# Patient Record
Sex: Female | Born: 1960 | Race: White | Hispanic: No | Marital: Married | Smoking: Never smoker
Health system: Southern US, Community
[De-identification: ages and names within clinical notes are randomized; demographics above are authoritative.]

## PROBLEM LIST (undated history)

## (undated) DIAGNOSIS — IMO0002 Reserved for concepts with insufficient information to code with codable children: Secondary | ICD-10-CM

## (undated) DIAGNOSIS — Z9289 Personal history of other medical treatment: Secondary | ICD-10-CM

## (undated) DIAGNOSIS — R51 Headache: Secondary | ICD-10-CM

## (undated) DIAGNOSIS — Z124 Encounter for screening for malignant neoplasm of cervix: Secondary | ICD-10-CM

## (undated) DIAGNOSIS — R87619 Unspecified abnormal cytological findings in specimens from cervix uteri: Secondary | ICD-10-CM

## (undated) DIAGNOSIS — I4510 Unspecified right bundle-branch block: Secondary | ICD-10-CM

## (undated) DIAGNOSIS — R87629 Unspecified abnormal cytological findings in specimens from vagina: Secondary | ICD-10-CM

## (undated) DIAGNOSIS — R519 Headache, unspecified: Secondary | ICD-10-CM

## (undated) DIAGNOSIS — T7840XA Allergy, unspecified, initial encounter: Secondary | ICD-10-CM

## (undated) DIAGNOSIS — G8929 Other chronic pain: Secondary | ICD-10-CM

## (undated) HISTORY — PX: TONSILLECTOMY: SUR1361

## (undated) HISTORY — DX: Other chronic pain: G89.29

## (undated) HISTORY — DX: Encounter for screening for malignant neoplasm of cervix: Z12.4

## (undated) HISTORY — DX: Allergy, unspecified, initial encounter: T78.40XA

## (undated) HISTORY — DX: Personal history of other medical treatment: Z92.89

## (undated) HISTORY — PX: TUBAL LIGATION: SHX77

## (undated) HISTORY — DX: Headache: R51

## (undated) HISTORY — PX: ENDOMETRIAL ABLATION: SHX621

## (undated) HISTORY — DX: Headache, unspecified: R51.9

## (undated) HISTORY — DX: Unspecified abnormal cytological findings in specimens from vagina: R87.629

## (undated) HISTORY — DX: Unspecified abnormal cytological findings in specimens from cervix uteri: R87.619

## (undated) HISTORY — DX: Reserved for concepts with insufficient information to code with codable children: IMO0002

## (undated) HISTORY — PX: WISDOM TOOTH EXTRACTION: SHX21

---

## 1998-09-15 DIAGNOSIS — J020 Streptococcal pharyngitis: Secondary | ICD-10-CM | POA: Insufficient documentation

## 2001-09-15 DIAGNOSIS — J329 Chronic sinusitis, unspecified: Secondary | ICD-10-CM | POA: Insufficient documentation

## 2002-01-14 DIAGNOSIS — N390 Urinary tract infection, site not specified: Secondary | ICD-10-CM | POA: Insufficient documentation

## 2002-12-19 ENCOUNTER — Ambulatory Visit (HOSPITAL_COMMUNITY): Admission: RE | Admit: 2002-12-19 | Discharge: 2002-12-19 | Payer: Self-pay | Admitting: Obstetrics and Gynecology

## 2005-04-26 ENCOUNTER — Ambulatory Visit (HOSPITAL_COMMUNITY): Admission: RE | Admit: 2005-04-26 | Discharge: 2005-04-26 | Payer: Self-pay | Admitting: Obstetrics and Gynecology

## 2006-04-02 ENCOUNTER — Ambulatory Visit: Payer: Self-pay | Admitting: Family Medicine

## 2007-07-04 ENCOUNTER — Ambulatory Visit: Payer: Self-pay | Admitting: Family Medicine

## 2007-09-27 ENCOUNTER — Ambulatory Visit: Payer: Self-pay | Admitting: Family Medicine

## 2007-12-02 ENCOUNTER — Ambulatory Visit: Payer: Self-pay | Admitting: Family Medicine

## 2007-12-17 ENCOUNTER — Ambulatory Visit: Payer: Self-pay | Admitting: Family Medicine

## 2008-01-13 ENCOUNTER — Other Ambulatory Visit: Admission: RE | Admit: 2008-01-13 | Discharge: 2008-01-13 | Payer: Self-pay | Admitting: Obstetrics & Gynecology

## 2008-03-02 ENCOUNTER — Ambulatory Visit: Payer: Self-pay | Admitting: Family Medicine

## 2008-03-13 ENCOUNTER — Ambulatory Visit: Payer: Self-pay | Admitting: Family Medicine

## 2008-07-07 ENCOUNTER — Ambulatory Visit: Payer: Self-pay | Admitting: Family Medicine

## 2008-09-29 ENCOUNTER — Ambulatory Visit: Payer: Self-pay | Admitting: Family Medicine

## 2009-11-24 ENCOUNTER — Ambulatory Visit: Payer: Self-pay | Admitting: Family Medicine

## 2010-02-08 ENCOUNTER — Other Ambulatory Visit: Admission: RE | Admit: 2010-02-08 | Discharge: 2010-02-08 | Payer: Self-pay | Admitting: Obstetrics & Gynecology

## 2010-02-17 ENCOUNTER — Encounter: Admission: RE | Admit: 2010-02-17 | Discharge: 2010-02-17 | Payer: Self-pay | Admitting: Obstetrics & Gynecology

## 2010-06-13 ENCOUNTER — Ambulatory Visit: Payer: Self-pay | Admitting: Physician Assistant

## 2011-02-16 ENCOUNTER — Encounter: Payer: Self-pay | Admitting: Family Medicine

## 2011-02-16 DIAGNOSIS — J329 Chronic sinusitis, unspecified: Secondary | ICD-10-CM

## 2011-02-16 DIAGNOSIS — N390 Urinary tract infection, site not specified: Secondary | ICD-10-CM

## 2011-02-16 DIAGNOSIS — J020 Streptococcal pharyngitis: Secondary | ICD-10-CM
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
## 2011-03-03 NOTE — Op Note (Signed)
Rebecca [name], Rebecca [name] NO.:  [id number]   MEDICAL RECORD NO.:  [id number]                   PATIENT TYPE:  AMB   LOCATION:  DAY                                  FACILITY:  [facility]   PHYSICIAN:  Tilda Burrow, M.D.              DATE OF BIRTH:  June 08, 1961   DATE OF PROCEDURE:  12/19/2002  DATE OF DISCHARGE:                                 OPERATIVE REPORT   PREOPERATIVE DIAGNOSES:  Elective sterilization.   POSTOPERATIVE DIAGNOSES:  Elective sterilization.   PROCEDURE:  Laparoscopic tubal sterilization. Falope ring.   SURGEON:  Tilda Burrow, M.D.   ASSISTANT:  None.   ANESTHESIA:  General.   COMPLICATIONS:  None.   FINDINGS:  Normal tubes and ovaries, no evidence of endometriosis, excellent  pelvic support. Small uterus without abnormality.   DETAILS OF PROCEDURE:  The patient was taken to the operating room, prepped  and  draped for a combined abdominal and vaginal procedure, with Hulka  tenaculum attached to the cervix for uterine  manipulation. Bladder in-and-  out catheterization. An infraumbilical, 1 cm vertical incision, as well as a  transverse suprapubic 1 cm incision. Veress needle was used to introduce  pneumoperitoneum through the umbilical incision with the pneumoperitoneum  easily introduced under 10 mmHg of pressure. Introduction of the Veress  needle was done, carefully elevating the abdominal wall and orienting the  needle toward the pelvis.   The laparoscopic trocar was then carefully introduced into the abdomen using  a similar technique, and the laparoscope was used to visualize normal pelvic  anatomy with no evidence of bleeding or trauma. The suprapubic trocar was  introduced under direct visualization, and then attention was directed to  the left fallopian tube, which was identified up to its fimbriated end,  elevated and a mid-segment loop of the tube was drawn up into the Falope  ring applier, Marcaine 0.25%  applied to the surface of the tube and the  Falope ring applied, inspected, and found to be in satisfactory position.  The opposite tube was then treated in a similar fashion. The mesosalpinx  beneath the Falope ring on each side was then infiltrated with approximately  3 cc of Marcaine 0.25%, using a transabdominal approach with a 22-gauge  spinal needle. Then the laparoscopic equipment was removed after instilling  200 cc of saline into  the abdomen and deflating the abdomen. Subcuticular 4-0 Dexon was used to  close the skin and Steri-Strips were placed on the skin surface. Sponge and  needle counts were correct. The patient tolerated the procedure well, was  awakened, and went to the recovery room in good condition.  Tilda Burrow, M.D.    JVF/MEDQ  D:  12/19/2002  T:  12/19/2002  Job:  [id number]

## 2011-03-14 ENCOUNTER — Other Ambulatory Visit (HOSPITAL_COMMUNITY)
Admission: RE | Admit: 2011-03-14 | Discharge: 2011-03-14 | Disposition: A | Discharge status: Home / Self Care | Payer: BC Managed Care – PPO | Source: Ambulatory Visit | Attending: Obstetrics & Gynecology | Admitting: Obstetrics & Gynecology

## 2011-03-14 ENCOUNTER — Other Ambulatory Visit: Payer: Self-pay | Admitting: Obstetrics & Gynecology

## 2011-03-14 DIAGNOSIS — Z01419 Encounter for gynecological examination (general) (routine) without abnormal findings: Secondary | ICD-10-CM | POA: Insufficient documentation

## 2011-03-15 ENCOUNTER — Other Ambulatory Visit: Payer: Self-pay | Admitting: Obstetrics & Gynecology

## 2011-03-15 DIAGNOSIS — Z1231 Encounter for screening mammogram for malignant neoplasm of breast: Secondary | ICD-10-CM

## 2011-03-22 ENCOUNTER — Ambulatory Visit
Admission: RE | Admit: 2011-03-22 | Discharge: 2011-03-22 | Disposition: A | Discharge status: Home / Self Care | Payer: BC Managed Care – PPO | Source: Ambulatory Visit | Attending: Obstetrics & Gynecology | Admitting: Obstetrics & Gynecology

## 2011-03-22 DIAGNOSIS — Z1231 Encounter for screening mammogram for malignant neoplasm of breast: Secondary | ICD-10-CM

## 2011-08-24 ENCOUNTER — Encounter: Payer: Self-pay | Admitting: Medical

## 2011-08-24 ENCOUNTER — Ambulatory Visit (INDEPENDENT_AMBULATORY_CARE_PROVIDER_SITE_OTHER): Payer: BC Managed Care – PPO | Admitting: Medical

## 2011-08-24 VITALS — BP 110/78 | HR 72 | Temp 98.0°F | Resp 18 | Wt 150.0 lb

## 2011-08-24 DIAGNOSIS — R071 Chest pain on breathing: Secondary | ICD-10-CM

## 2011-08-24 DIAGNOSIS — R0789 Other chest pain: Secondary | ICD-10-CM
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
## 2011-08-24 NOTE — Patient Instructions (Signed)
Begin trial of Prilosec 30-45 minutes prior to breakfast.  Aleve OTC once daily.  If not improving in 1-2 weeks let me know.

## 2011-08-25 ENCOUNTER — Telehealth: Payer: Self-pay | Admitting: Family Medicine

## 2011-08-25 NOTE — Telephone Encounter (Signed)
Message copied by Janeice Robinson on Fri Aug 25, 2011  1:53 PM ------      Message from: Aleen Campi, DAVID S      Created: Thu Aug 24, 2011  9:01 PM       i forgot to have her do a urine sample Thursday.  If she is still having any urinary discomfort, frequent urination, blood in urine, burning, etc., she can come in for nurse visit to do clean catch urine and possibly culture.  I apologize for not remembering to do this before she left.              Also, if they are doing labs at her work tomorrow, she could ask them to check a dipstick urine and send me the result.

## 2011-09-13 ENCOUNTER — Encounter (HOSPITAL_COMMUNITY): Payer: Self-pay

## 2011-09-13 ENCOUNTER — Other Ambulatory Visit: Payer: Self-pay | Admitting: Obstetrics & Gynecology

## 2011-09-13 ENCOUNTER — Encounter (HOSPITAL_COMMUNITY)
Admission: RE | Admit: 2011-09-13 | Discharge: 2011-09-13 | Disposition: A | Discharge status: Home / Self Care | Payer: BC Managed Care – PPO | Source: Ambulatory Visit | Attending: Obstetrics & Gynecology | Admitting: Obstetrics & Gynecology

## 2011-09-13 ENCOUNTER — Encounter (HOSPITAL_COMMUNITY): Payer: Self-pay | Admitting: Pharmacy Technician

## 2011-09-13 LAB — SURGICAL PCR SCREEN: Staphylococcus aureus: NEGATIVE

## 2011-09-13 NOTE — Patient Instructions (Addendum)
20 Rebecca [name]  09/13/2011   Your procedure is scheduled on:  09/20/2011  Report to [facility] at  1020  AM.  Call this number if you have problems the morning of surgery: [phone]   Remember:   Do not eat food:After Midnight.  May have clear liquids:until Midnight .  Clear liquids include soda, tea, black coffee, apple or grape juice, broth.  Take these medicines the morning of surgery with A SIP OF WATER: none   Do not wear jewelry, make-up or nail polish.  Do not wear lotions, powders, or perfumes. You may wear deodorant.  Do not shave 48 hours prior to surgery.  Do not bring valuables to the hospital.  Contacts, dentures or bridgework may not be worn into surgery.  Leave suitcase in the car. After surgery it may be brought to your room.  For patients admitted to the hospital, checkout time is 11:00 AM the day of discharge.   Patients discharged the day of surgery will not be allowed to drive home.  Name and phone number of your driver: family  Special Instructions: CHG Shower Use Special Wash: 1/2 bottle night before surgery and 1/2 bottle morning of surgery.   Please read over the following fact sheets that you were given: Pain Booklet, MRSA Information, Surgical Site Infection Prevention, Anesthesia Post-op Instructions and Care and Recovery After Surgery Endometrial Ablation Endometrial ablation removes the lining of the uterus (endometrium). It is usually a same day, outpatient treatment. Ablation helps avoid major surgery (such as a hysterectomy). A hysterectomy is removal of the cervix and uterus. Endometrial ablation has less risk and complications, has a shorter recovery period and is less expensive. After endometrial ablation, most women will have little or no menstrual bleeding. You may not keep your fertility. Pregnancy is no longer likely after this procedure but if you are pre-menopausal, you still need to use a reliable method of birth control following the procedure  because pregnancy can occur. REASONS TO HAVE THE PROCEDURE MAY INCLUDE:  Heavy periods.   Bleeding that is causing anemia.   Anovulatory bleeding, very irregular, bleeding.   Bleeding submucous fibroids (on the lining inside the uterus) if they are smaller than 3 centimeters.  REASONS NOT TO HAVE THE PROCEDURE MAY INCLUDE:  You wish to have more children.   You have a pre-cancerous or cancerous problem. The cause of any abnormal bleeding must be diagnosed before having the procedure.   You have pain coming from the uterus.   You have a submucus fibroid larger than 3 centimeters.   You recently had a baby.   You recently had an infection in the uterus.   You have a severe retro-flexed, tipped uterus and cannot insert the instrument to do the ablation.   You had a Cesarean section or deep major surgery on the uterus.   The inner cavity of the uterus is too large for the endometrial ablation instrument.  RISKS AND COMPLICATIONS   Perforation of the uterus.   Bleeding.   Infection of the uterus, bladder or vagina.   Injury to surrounding organs.   Cutting the cervix.   An air bubble to the lung (air embolus).   Pregnancy following the procedure.   Failure of the procedure to help the problem requiring hysterectomy.   Decreased ability to diagnose cancer in the lining of the uterus.  BEFORE THE PROCEDURE  The lining of the uterus must be tested to make sure there is no pre-cancerous or cancer  cells present.   Medications may be given to make the lining of the uterus thinner.   Ultrasound may be used to evaluate the size and look for abnormalities of the uterus.   Future pregnancy is not desired.  PROCEDURE  There are different ways to destroy the lining of the uterus.   Resectoscope - radio frequency-alternating electric current is the most common one used.   Cryotherapy - freezing the lining of the uterus.   Heated Free Liquid - heated salt (saline)  solution inserted into the uterus.   Microwave - uses high energy microwaves in the uterus.   Thermal Balloon - a catheter with a balloon tip is inserted into the uterus and filled with heated fluid.  Your caregiver will talk with you about the method used in this clinic. They will also instruct you on the pros and cons of the procedure. Endometrial ablation is performed along with a procedure called operative hysteroscopy. A narrow viewing tube is inserted through the birth canal (vagina) and through the cervix into the uterus. A tiny camera attached to the viewing tube (hysteroscope) allows the uterine cavity to be shown on a TV monitor during surgery. Your uterus is filled with a harmless liquid to make the procedure easier. The lining of the uterus is then removed. The lining can also be removed with a resectoscope which allows your surgeon to cut away the lining of the uterus under direct vision. Usually, you will be able to go home within an hour after the procedure. HOME CARE INSTRUCTIONS   Do not drive for 24 hours.   No tampons, douching or intercourse for 2 weeks or until your caregiver approves.   Rest at home for 24 to 48 hours. You may then resume normal activities unless told differently by your caregiver.   Take your temperature two times a day for 4 days, and record it.   Take any medications your caregiver has ordered, as directed.   Use some form of contraception if you are pre-menopausal and do not want to get pregnant.  Bleeding after the procedure is normal. It varies from light spotting and mildly watery to bloody discharge for 4 to 6 weeks. You may also have mild cramping. Only take over-the-counter or prescription medicines for pain, discomfort, or fever as directed by your caregiver. Do not use aspirin, as this may aggravate bleeding. Frequent urination during the first 24 hours is normal. You will not know how effective your surgery is until at least 3 months after the  surgery. SEEK IMMEDIATE MEDICAL CARE IF:   Bleeding is heavier than a normal menstrual cycle.   An oral temperature above 102 F (38.9 C) develops.   You have increasing cramps or pains not relieved with medication or develop belly (abdominal) pain which does not seem to be related to the same area of earlier cramping and pain.   You are light headed, weak or have fainting episodes.   You develop pain in the shoulder strap areas.   You have chest or leg pain.   You have abnormal vaginal discharge.   You have painful urination.  Document Released: 08/11/2004 Document Revised: 06/14/2011 Document Reviewed: 11/09/2007 [facility] Patient Information 2012 [place], [place].PATIENT INSTRUCTIONS POST-ANESTHESIA  IMMEDIATELY FOLLOWING SURGERY:  Do not drive or operate machinery for the first twenty four hours after surgery.  Do not make any important decisions for twenty four hours after surgery or while taking narcotic pain medications or sedatives.  If you develop intractable nausea  and vomiting or a severe headache please notify your doctor immediately.  FOLLOW-UP:  Please make an appointment with your surgeon as instructed. You do not need to follow up with anesthesia unless specifically instructed to do so.  WOUND CARE INSTRUCTIONS (if applicable):  Keep a dry clean dressing on the anesthesia/puncture wound site if there is drainage.  Once the wound has quit draining you may leave it open to air.  Generally you should leave the bandage intact for twenty four hours unless there is drainage.  If the epidural site drains for more than 36-48 hours please call the anesthesia department.  QUESTIONS?:  Please feel free to call your physician or the hospital operator if you have any questions, and they will be happy to assist you.     [facility] Anesthesia Department [address] [place] [phone]

## 2011-09-15 ENCOUNTER — Other Ambulatory Visit (HOSPITAL_COMMUNITY): Payer: BC Managed Care – PPO

## 2011-09-20 ENCOUNTER — Encounter (HOSPITAL_COMMUNITY): Payer: Self-pay | Admitting: Anesthesiology

## 2011-09-20 ENCOUNTER — Encounter (HOSPITAL_COMMUNITY)
Admission: RE | Disposition: A | Discharge status: Home / Self Care | Payer: Self-pay | Source: Ambulatory Visit | Attending: Obstetrics & Gynecology

## 2011-09-20 ENCOUNTER — Ambulatory Visit (HOSPITAL_COMMUNITY): Payer: BC Managed Care – PPO | Admitting: Anesthesiology

## 2011-09-20 ENCOUNTER — Other Ambulatory Visit: Payer: Self-pay | Admitting: Obstetrics & Gynecology

## 2011-09-20 ENCOUNTER — Ambulatory Visit (HOSPITAL_COMMUNITY)
Admission: RE | Admit: 2011-09-20 | Discharge: 2011-09-20 | Disposition: A | Discharge status: Home / Self Care | Payer: BC Managed Care – PPO | Source: Ambulatory Visit | Attending: Obstetrics & Gynecology | Admitting: Obstetrics & Gynecology

## 2011-09-20 ENCOUNTER — Encounter (HOSPITAL_COMMUNITY): Payer: Self-pay | Admitting: *Deleted

## 2011-09-20 DIAGNOSIS — Z9889 Other specified postprocedural states: Secondary | ICD-10-CM

## 2011-09-20 DIAGNOSIS — N920 Excessive and frequent menstruation with regular cycle: Secondary | ICD-10-CM | POA: Insufficient documentation

## 2011-09-20 DIAGNOSIS — N946 Dysmenorrhea, unspecified: Secondary | ICD-10-CM | POA: Insufficient documentation

## 2011-09-20 SURGERY — DILATATION & CURETTAGE/HYSTEROSCOPY WITH THERMACHOICE ABLATION
Anesthesia: General | Site: Uterus | Wound class: Clean Contaminated

## 2011-09-20 MED ORDER — LACTATED RINGERS IV SOLN
INTRAVENOUS | Status: DC
  Administered 2011-09-20: 1000 mL via INTRAVENOUS

## 2011-09-20 MED ORDER — MIDAZOLAM HCL 2 MG/2ML IJ SOLN
1.0000 mg | INTRAMUSCULAR | Status: DC | PRN
  Administered 2011-09-20 (×2): 2 mg via INTRAVENOUS

## 2011-09-20 MED ORDER — SCOPOLAMINE 1 MG/3DAYS TD PT72
MEDICATED_PATCH | TRANSDERMAL | Status: AC
  Filled 2011-09-20: qty 1

## 2011-09-20 MED ORDER — LACTATED RINGERS IV SOLN: INTRAVENOUS | Status: DC

## 2011-09-20 MED ORDER — FENTANYL CITRATE 0.05 MG/ML IJ SOLN
INTRAMUSCULAR | Status: DC | PRN
  Administered 2011-09-20 (×2): 50 ug via INTRAVENOUS

## 2011-09-20 MED ORDER — SCOPOLAMINE 1 MG/3DAYS TD PT72
1.0000 | MEDICATED_PATCH | Freq: Once | TRANSDERMAL | Status: DC
  Administered 2011-09-20: 1.5 mg via TRANSDERMAL

## 2011-09-20 MED ORDER — ONDANSETRON HCL 4 MG/2ML IJ SOLN: 4.0000 mg | Freq: Once | INTRAMUSCULAR | Status: DC | PRN

## 2011-09-20 MED ORDER — MIDAZOLAM HCL 2 MG/2ML IJ SOLN: 1.0000 mg | INTRAMUSCULAR | Status: DC | PRN

## 2011-09-20 MED ORDER — DEXTROSE 5 % IV SOLN
INTRAVENOUS | Status: DC | PRN
  Administered 2011-09-20: 250 mL via INTRAVENOUS

## 2011-09-20 MED ORDER — FENTANYL CITRATE 0.05 MG/ML IJ SOLN
INTRAMUSCULAR | Status: AC
  Administered 2011-09-20: 50 ug via INTRAVENOUS
  Filled 2011-09-20: qty 2

## 2011-09-20 MED ORDER — FENTANYL CITRATE 0.05 MG/ML IJ SOLN
25.0000 ug | INTRAMUSCULAR | Status: DC | PRN
  Administered 2011-09-20: 50 ug via INTRAVENOUS

## 2011-09-20 MED ORDER — KETOROLAC TROMETHAMINE 30 MG/ML IJ SOLN
30.0000 mg | Freq: Once | INTRAMUSCULAR | Status: AC
  Administered 2011-09-20: 30 mg via INTRAVENOUS

## 2011-09-20 MED ORDER — DEXAMETHASONE SODIUM PHOSPHATE 4 MG/ML IJ SOLN: 4.0000 mg | Freq: Once | INTRAMUSCULAR | Status: DC

## 2011-09-20 MED ORDER — CEFAZOLIN SODIUM 1-5 GM-% IV SOLN
1.0000 g | INTRAVENOUS | Status: AC
  Administered 2011-09-20: 1 g via INTRAVENOUS

## 2011-09-20 MED ORDER — SODIUM CHLORIDE 0.9 % IR SOLN
Status: DC | PRN
  Administered 2011-09-20: 3000 mL

## 2011-09-20 MED ORDER — KETOROLAC TROMETHAMINE 30 MG/ML IJ SOLN
INTRAMUSCULAR | Status: AC
  Filled 2011-09-20: qty 1

## 2011-09-20 MED ORDER — DEXAMETHASONE SODIUM PHOSPHATE 4 MG/ML IJ SOLN
INTRAMUSCULAR | Status: AC
  Administered 2011-09-20: 4 mg via INTRAVENOUS
  Filled 2011-09-20: qty 1

## 2011-09-20 MED ORDER — KETOROLAC TROMETHAMINE 10 MG PO TABS: 10.0000 mg | ORAL_TABLET | Freq: Three times a day (TID) | ORAL | Status: AC | PRN

## 2011-09-20 MED ORDER — PROPOFOL 10 MG/ML IV BOLUS
INTRAVENOUS | Status: DC | PRN
  Administered 2011-09-20: 20 mg via INTRAVENOUS
  Administered 2011-09-20: 150 mg via INTRAVENOUS

## 2011-09-20 MED ORDER — CEFAZOLIN SODIUM 1-5 GM-% IV SOLN
INTRAVENOUS | Status: AC
  Filled 2011-09-20: qty 50

## 2011-09-20 MED ORDER — SODIUM CHLORIDE 0.9 % IR SOLN
Status: DC | PRN
  Administered 2011-09-20: 1000 mL

## 2011-09-20 MED ORDER — DEXAMETHASONE SODIUM PHOSPHATE 4 MG/ML IJ SOLN
4.0000 mg | Freq: Once | INTRAMUSCULAR | Status: AC
  Administered 2011-09-20: 4 mg via INTRAVENOUS

## 2011-09-20 MED ORDER — MIDAZOLAM HCL 2 MG/2ML IJ SOLN
INTRAMUSCULAR | Status: AC
  Administered 2011-09-20: 2 mg via INTRAVENOUS
  Filled 2011-09-20: qty 2

## 2011-09-20 MED ORDER — ONDANSETRON HCL 4 MG/2ML IJ SOLN
4.0000 mg | Freq: Once | INTRAMUSCULAR | Status: AC
  Administered 2011-09-20: 4 mg via INTRAVENOUS

## 2011-09-20 MED ORDER — SCOPOLAMINE 1 MG/3DAYS TD PT72: 1.0000 | MEDICATED_PATCH | Freq: Once | TRANSDERMAL | Status: DC

## 2011-09-20 MED ORDER — HYDROCODONE-ACETAMINOPHEN 5-500 MG PO TABS: 1.0000 | ORAL_TABLET | Freq: Four times a day (QID) | ORAL | Status: AC | PRN

## 2011-09-20 MED ORDER — ONDANSETRON HCL 4 MG/2ML IJ SOLN: 4.0000 mg | Freq: Once | INTRAMUSCULAR | Status: DC

## 2011-09-20 MED ORDER — LIDOCAINE HCL (PF) 1 % IJ SOLN
INTRAMUSCULAR | Status: AC
  Filled 2011-09-20: qty 5

## 2011-09-20 MED ORDER — ONDANSETRON HCL 4 MG/2ML IJ SOLN
INTRAMUSCULAR | Status: AC
  Administered 2011-09-20: 4 mg via INTRAVENOUS
  Filled 2011-09-20: qty 2

## 2011-09-20 MED ORDER — MIDAZOLAM HCL 2 MG/2ML IJ SOLN
INTRAMUSCULAR | Status: AC
  Filled 2011-09-20: qty 2

## 2011-09-20 MED ORDER — ONDANSETRON HCL 8 MG PO TABS: 8.0000 mg | ORAL_TABLET | Freq: Three times a day (TID) | ORAL | Status: AC | PRN

## 2011-09-20 MED ORDER — PROPOFOL 10 MG/ML IV EMUL
INTRAVENOUS | Status: AC
  Filled 2011-09-20: qty 20

## 2011-09-20 MED ORDER — LIDOCAINE HCL (CARDIAC) 10 MG/ML IV SOLN
INTRAVENOUS | Status: DC | PRN
  Administered 2011-09-20: 10 mg via INTRAVENOUS

## 2011-09-20 SURGICAL SUPPLY — 32 items
BAG DECANTER FOR FLEXI CONT (MISCELLANEOUS) ×2 IMPLANT
BAG HAMPER (MISCELLANEOUS) ×2 IMPLANT
CATH THERMACHOICE III (CATHETERS) ×2 IMPLANT
CLOTH BEACON ORANGE TIMEOUT ST (SAFETY) ×2 IMPLANT
COVER LIGHT HANDLE STERIS (MISCELLANEOUS) ×4 IMPLANT
FORMALIN 10 PREFIL 120ML (MISCELLANEOUS) ×2 IMPLANT
GAUZE SPONGE 4X4 16PLY XRAY LF (GAUZE/BANDAGES/DRESSINGS) ×2 IMPLANT
GLOVE BIOGEL PI IND STRL 6.5 (GLOVE) IMPLANT
GLOVE BIOGEL PI IND STRL 8 (GLOVE) ×1 IMPLANT
GLOVE BIOGEL PI INDICATOR 6.5 (GLOVE) ×1
GLOVE BIOGEL PI INDICATOR 8 (GLOVE) ×2
GLOVE ECLIPSE 6.5 STRL STRAW (GLOVE) ×1 IMPLANT
GLOVE ECLIPSE 7.0 STRL STRAW (GLOVE) ×1 IMPLANT
GLOVE ECLIPSE 8.0 STRL XLNG CF (GLOVE) ×2 IMPLANT
GOWN BRE IMP SLV AUR LG STRL (GOWN DISPOSABLE) ×1 IMPLANT
GOWN STRL REIN XL XLG (GOWN DISPOSABLE) ×2 IMPLANT
INST SET HYSTEROSCOPY (KITS) ×2 IMPLANT
IV D5W 500ML (IV SOLUTION) ×2 IMPLANT
IV NS IRRIG 3000ML ARTHROMATIC (IV SOLUTION) ×2 IMPLANT
KIT ROOM TURNOVER APOR (KITS) ×2 IMPLANT
MANIFOLD NEPTUNE II (INSTRUMENTS) ×2 IMPLANT
MARKER SKIN DUAL TIP RULER LAB (MISCELLANEOUS) ×2 IMPLANT
NS IRRIG 1000ML POUR BTL (IV SOLUTION) ×2 IMPLANT
PACK BASIC III (CUSTOM PROCEDURE TRAY) ×2
PACK SRG BSC III STRL LF ECLPS (CUSTOM PROCEDURE TRAY) ×1 IMPLANT
PAD ARMBOARD 7.5X6 YLW CONV (MISCELLANEOUS) ×2 IMPLANT
PAD TELFA 3X4 1S STER (GAUZE/BANDAGES/DRESSINGS) ×2 IMPLANT
PENCIL HANDSWITCHING (ELECTRODE) ×1 IMPLANT
SET BASIN LINEN APH (SET/KITS/TRAYS/PACK) ×2 IMPLANT
SET IRRIG Y TYPE TUR BLADDER L (SET/KITS/TRAYS/PACK) ×2 IMPLANT
SHEET LAVH (DRAPES) ×2 IMPLANT
YANKAUER SUCT BULB TIP 10FT TU (MISCELLANEOUS) ×2 IMPLANT

## 2011-09-20 NOTE — Anesthesia Procedure Notes (Addendum)
Procedure Name: LMA Insertion Date/Time: 09/20/2011 1:18 PM Performed by: Minerva Areola Pre-anesthesia Checklist: Patient identified, Patient being monitored, Emergency Drugs available, Timeout performed and Suction available Patient Re-evaluated:Patient Re-evaluated prior to inductionOxygen Delivery Method: Circle System Utilized Preoxygenation: Pre-oxygenation with 100% oxygen Intubation Type: IV induction Ventilation: Mask ventilation without difficulty LMA: LMA inserted LMA Size: 4.0 Number of attempts: 1 Placement Confirmation: positive ETCO2 and breath sounds checked- equal and bilateral

## 2011-09-20 NOTE — Op Note (Signed)
Preoperative diagnosis: Menometrorrhagia                                        Dysmenorrhea   Postoperative diagnoses: Same as above   Procedure: Hysteroscopy, uterine curettage, endometrial ablation  Surgeon: Despina Hidden MD  Anesthesia: Laryngeal mask airway  Findings: The endometrium was normal. There were no fibroid or other abnormalities.  Description of operation: The patient was taken to the operating room and placed in the supine position. She underwent general anesthesia using the laryngeal mask airway. She was placed in the dorsal lithotomy position and prepped and draped in the usual sterile fashion. A Graves speculum was placed and the anterior cervical lip was grasped with a single-tooth tenaculum. The cervix was dilated serially to allow passage of the hysteroscope. Diagnostic hysteroscopy was performed and was found to be normal. A vigorous uterine curettage was then performed and all tissue sent to pathology for evaluation. The ThermaChoice 3 endometrial ablation balloon was then used were 9 cc of D5W was required to maintain a pressure of 190-200 mm of mercury throughout the procedure.  Total therapy time was 8:42.    Temperature was 87 degrees Celsius.  All of the equipment worked well throughout the procedure. All of the fluid was returned at the end of the procedure. The patient was awakened from anesthesia and taken to the recovery room in good stable condition all counts were correct. She received 1 g of Ancef and 30 mg of Toradol preoperatively. She will be discharged from the recovery room and followed up in the office next week.  EURE,LUTHER H 2:00 PM 09/20/2011

## 2011-09-20 NOTE — Anesthesia Preprocedure Evaluation (Addendum)
Anesthesia Evaluation  Patient identified by MRN, date of birth, ID band Patient awake    Reviewed: Allergy & Precautions, H&P , NPO status , Patient's Chart, lab work & pertinent test results  History of Anesthesia Complications (+) PONV  Airway Mallampati: II      Dental  (+) Teeth Intact   Pulmonary neg pulmonary ROS,    Pulmonary exam normal       Cardiovascular neg cardio ROS Regular Normal    Neuro/Psych    GI/Hepatic   Endo/Other    Renal/GU      Musculoskeletal   Abdominal   Peds  Hematology   Anesthesia Other Findings   Reproductive/Obstetrics                          Anesthesia Physical Anesthesia Plan  ASA: I  Anesthesia Plan: General   Post-op Pain Management:    Induction: Intravenous  Airway Management Planned: LMA  Additional Equipment:   Intra-op Plan:   Post-operative Plan: Extubation in OR  Informed Consent: I have reviewed the patients History and Physical, chart, labs and discussed the procedure including the risks, benefits and alternatives for the proposed anesthesia with the patient or authorized representative who has indicated his/her understanding and acceptance.     Plan Discussed with:   Anesthesia Plan Comments:         Anesthesia Quick Evaluation

## 2011-09-20 NOTE — Anesthesia Postprocedure Evaluation (Signed)
Anesthesia Post Note  Patient: Rebecca [name]  Procedure(s) Performed:  DILATATION & CURETTAGE/HYSTEROSCOPY WITH THERMACHOICE ABLATION - Total therapy time: 8:42 ;  9  mls in,   9  mls out  Anesthesia type: General  Patient location: PACU  Post pain: Pain level controlled  Post assessment: Post-op Vital signs reviewed, Patient's Cardiovascular Status Stable, Respiratory Function Stable, Patent Airway, No signs of Nausea or vomiting and Pain level controlled  Last Vitals:  Filed Vitals:   09/20/11 1413  BP: 123/56  Pulse: 76  Temp:   Resp:     Post vital signs: Reviewed and stable R14 T36.9  Level of consciousness: awake and alert   Complications: No apparent anesthesia complications

## 2011-09-20 NOTE — Transfer of Care (Signed)
Immediate Anesthesia Transfer of Care Note  Patient: Rebecca [name]  Procedure(s) Performed:  DILATATION & CURETTAGE/HYSTEROSCOPY WITH THERMACHOICE ABLATION - Total therapy time: 8:42 ;  9  mls in,   9  mls out  Patient Location: PACU  Anesthesia Type: General  Level of Consciousness: awake  Airway & Oxygen Therapy: Patient Spontanous Breathing and non-rebreather face mask  Post-op Assessment: Report given to PACU RN, Post -op Vital signs reviewed and stable and Patient moving all extremities  Post vital signs: Reviewed and stable  Complications: No apparent anesthesia complications

## 2011-09-20 NOTE — H&P (Signed)
Rebecca [name] is an 50 y.o. female. Perimenopausal that despite all of our efforts cannot prevent her from bleeding continuously.  We have tried every conceivable progesterone regimen without success.  As a result we will proceed with ablation  Past Medical History  Diagnosis Date  . Herpes simplex   . PONV (postoperative nausea and vomiting)     Past Surgical History  Procedure Date  . Wisdom tooth extraction   . Tonsillectomy     age 76  . Tubal ligation 9 yrs ago    [name]    Family History  Problem Relation Age of Onset  . Arthritis Maternal Aunt   . Gallbladder disease Maternal Aunt   . Arthritis Paternal Aunt   . Arthritis Maternal Grandmother   . Arthritis Maternal Grandfather   . Cancer Maternal Grandfather     leukemia  . Diabetes Maternal Grandfather   . Arthritis Paternal Grandfather   . Asthma Paternal Grandfather   . COPD Paternal Grandfather   . Hyperlipidemia Mother   . Hypertension Mother   . Anesthesia problems Neg Hx   . Hypotension Neg Hx   . Malignant hyperthermia Neg Hx   . Pseudochol deficiency Neg Hx     Social History:  reports that she has never smoked. She does not have any smokeless tobacco history on file. She reports that she drinks about 4.2 ounces of alcohol per week. She reports that she does not use illicit drugs.  Allergies: No Known Allergies  Prescriptions prior to admission  Medication Sig Dispense Refill  . Estradiol (ELESTRIN) 0.52 MG/0.87 GM (0.06%) GEL Apply 1 application topically daily.       Marland Kitchen ibuprofen (ADVIL,MOTRIN) 200 MG tablet Take 400 mg by mouth every 6 (six) hours as needed. For pain         ROS  Review of Systems  Constitutional: Negative for fever, chills, weight loss, malaise/fatigue and diaphoresis.  HENT: Negative for hearing loss, ear pain, nosebleeds, congestion, sore throat, neck pain, tinnitus and ear discharge.   Eyes: Negative for blurred vision, double vision, photophobia, pain, discharge and  redness.  Respiratory: Negative for cough, hemoptysis, sputum production, shortness of breath, wheezing and stridor.   Cardiovascular: Negative for chest pain, palpitations, orthopnea, claudication, leg swelling and PND.  Gastrointestinal: Negative for abdominal pain. Negative for heartburn, nausea, vomiting, diarrhea, constipation, blood in stool and melena.  Genitourinary: Negative for dysuria, urgency, frequency, hematuria and flank pain.  Musculoskeletal: Negative for myalgias, back pain, joint pain and falls.  Skin: Negative for itching and rash.  Neurological: Negative for dizziness, tingling, tremors, sensory change, speech change, focal weakness, seizures, loss of consciousness, weakness and headaches.  Endo/Heme/Allergies: Negative for environmental allergies and polydipsia. Does not bruise/bleed easily.  Psychiatric/Behavioral: Negative for depression, suicidal ideas, hallucinations, memory loss and substance abuse. The patient is not nervous/anxious and does not have insomnia.      Blood pressure 126/84, pulse 74, temperature 97.6 F (36.4 C), temperature source Oral, resp. rate 19, last menstrual period 08/21/2011, SpO2 100.00%. Physical Exam Physical Exam  Vitals reviewed. Constitutional: She is oriented to person, place, and time. She appears well-developed and well-nourished.  HENT:  Head: Normocephalic and atraumatic.  Right Ear: External ear normal.  Left Ear: External ear normal.  Nose: Nose normal.  Mouth/Throat: Oropharynx is clear and moist.  Eyes: Conjunctivae and EOM are normal. Pupils are equal, round, and reactive to light. Right eye exhibits no discharge. Left eye exhibits no discharge. No scleral icterus.  Neck:  Normal range of motion. Neck supple. No tracheal deviation present. No thyromegaly present.  Cardiovascular: Normal rate, regular rhythm, normal heart sounds and intact distal pulses.  Exam reveals no gallop and no friction rub.   No murmur  heard. Respiratory: Effort normal and breath sounds normal. No respiratory distress. She has no wheezes. She has no rales. She exhibits no tenderness.  GI: Soft. Bowel sounds are normal. She exhibits no distension and no mass. There is no  tenderness. There is no rebound and no guarding.  Genitourinary:       Vulva is normal without lesions Vagina is pink moist without discharge Cervix normal in appearance and pap is normal Uterus is normal by sonogram Adnexa is negative with normal sized ovaries by sonogram  Musculoskeletal: Normal range of motion. She exhibits no edema and no tenderness.  Neurological: She is alert and oriented to person, place, and time. She has normal reflexes. She displays normal reflexes. No cranial nerve deficit. She exhibits normal muscle tone. Coordination normal.  Skin: Skin is warm and dry. No rash noted. No erythema. No pallor.  Psychiatric: She has a normal mood and affect. Her behavior is normal. Judgment and thought content normal.   Lab results from outside lab reviewed and normal        Assessment/Plan: Menometrorrhagia and dysmenorrhea, normal sonogram  Hysteroscopy D&C endometrial ablation.  Patient understands risks of failure    EURE,LUTHER H 09/20/2011, 12:26 PM

## 2012-02-14 DIAGNOSIS — Z124 Encounter for screening for malignant neoplasm of cervix: Secondary | ICD-10-CM

## 2012-02-14 HISTORY — DX: Encounter for screening for malignant neoplasm of cervix: Z12.4

## 2012-02-22 ENCOUNTER — Other Ambulatory Visit: Payer: Self-pay | Admitting: Obstetrics & Gynecology

## 2012-02-22 DIAGNOSIS — Z1231 Encounter for screening mammogram for malignant neoplasm of breast: Secondary | ICD-10-CM

## 2012-03-08 ENCOUNTER — Other Ambulatory Visit (HOSPITAL_COMMUNITY)
Admission: RE | Admit: 2012-03-08 | Discharge: 2012-03-08 | Disposition: A | Discharge status: Home / Self Care | Payer: BC Managed Care – PPO | Source: Ambulatory Visit | Attending: Obstetrics & Gynecology | Admitting: Obstetrics & Gynecology

## 2012-03-08 ENCOUNTER — Other Ambulatory Visit: Payer: Self-pay | Admitting: Obstetrics & Gynecology

## 2012-03-08 DIAGNOSIS — Z01419 Encounter for gynecological examination (general) (routine) without abnormal findings: Secondary | ICD-10-CM | POA: Insufficient documentation

## 2012-03-16 DIAGNOSIS — Z9289 Personal history of other medical treatment: Secondary | ICD-10-CM

## 2012-03-16 HISTORY — DX: Personal history of other medical treatment: Z92.89

## 2012-03-22 ENCOUNTER — Ambulatory Visit
Admission: RE | Admit: 2012-03-22 | Discharge: 2012-03-22 | Disposition: A | Discharge status: Home / Self Care | Payer: BC Managed Care – PPO | Source: Ambulatory Visit | Attending: Obstetrics & Gynecology | Admitting: Obstetrics & Gynecology

## 2012-03-22 DIAGNOSIS — Z1231 Encounter for screening mammogram for malignant neoplasm of breast: Secondary | ICD-10-CM

## 2012-05-14 ENCOUNTER — Ambulatory Visit (INDEPENDENT_AMBULATORY_CARE_PROVIDER_SITE_OTHER): Payer: BC Managed Care – PPO | Admitting: Medical

## 2012-05-14 ENCOUNTER — Encounter: Payer: Self-pay | Admitting: Medical

## 2012-05-14 VITALS — BP 128/80 | HR 79 | Temp 97.8°F | Resp 14 | Ht 66.0 in | Wt 146.0 lb

## 2012-05-14 DIAGNOSIS — Z0000 Encounter for general adult medical examination without abnormal findings: Secondary | ICD-10-CM

## 2012-05-14 DIAGNOSIS — R079 Chest pain, unspecified: Secondary | ICD-10-CM

## 2012-05-14 DIAGNOSIS — Z1211 Encounter for screening for malignant neoplasm of colon: Secondary | ICD-10-CM

## 2012-05-14 DIAGNOSIS — Z23 Encounter for immunization: Secondary | ICD-10-CM

## 2012-05-14 LAB — POCT URINALYSIS DIPSTICK
Blood, UA: NEGATIVE
Ketones, UA: NEGATIVE
Leukocytes, UA: NEGATIVE
Nitrite, UA: NEGATIVE
Protein, UA: NEGATIVE
pH, UA: 6

## 2012-05-14 LAB — COMPREHENSIVE METABOLIC PANEL
ALT: 9 U/L (ref 0–35)
BUN: 11 mg/dL (ref 6–23)
CO2: 26 mEq/L (ref 19–32)
Calcium: 10 mg/dL (ref 8.4–10.5)
Chloride: 105 mEq/L (ref 96–112)
Creat: 0.78 mg/dL (ref 0.50–1.10)
Total Bilirubin: 0.8 mg/dL (ref 0.3–1.2)

## 2012-05-14 LAB — CBC
HCT: 41.3 % (ref 36.0–46.0)
Hemoglobin: 14.1 g/dL (ref 12.0–15.0)
MCV: 88.1 fL (ref 78.0–100.0)
RDW: 13.7 % (ref 11.5–15.5)
WBC: 7 10*3/uL (ref 4.0–10.5)

## 2012-05-14 LAB — LIPID PANEL
HDL: 60 mg/dL (ref >39)
LDL Cholesterol: 122 mg/dL — ABNORMAL HIGH (ref 0–99)
Total CHOL/HDL Ratio: 3.3 Ratio
Triglycerides: 84 mg/dL (ref <150)
VLDL: 17 mg/dL (ref 0–40)

## 2012-05-14 LAB — TSH: TSH: 2.66 u[IU]/mL (ref 0.350–4.500)

## 2012-05-14 NOTE — Progress Notes (Signed)
Subjective:   HPI  Rebecca [name] is a 51 y.o. female who presents for a complete physical.  Preventative care: Last ophthalmology visit: 3 years ago Last dental visit: 48mo ago Last tetanus booster: unknown Flu vaccine: yearly at work  Concerns: She sees gynecology, just saw in May, updated pap, mammogram, normal.  Is age 28 now, and no prior colonoscopy.  She has mole on right upper leg she wants looked at.  Saw dermatology 2 years ago for same.  Its slow growing, but derm didn't feel it to be worrisome.  Still getting the same chest pain, chest wall pain, bruise like pain she saw me for in November.  Its intermittent, dull, but ongoing.  She went to [place] recently and felt some dizziness with walking.  No associated nausea, numbness, weakness, SOB, dyspnea. Not related to food or other activity.   Reviewed their medical, surgical, family, social, medication, and allergy history and updated chart as appropriate.  Past Medical History  Diagnosis Date  . Herpes simplex     oral  . Allergy   . Chronic headache     associated with tension or sinus problems  . H/O mammogram 03/2012    sees gynecology  . Pap smear for cervical cancer screening 02/2012    Dr. Cheron Every    Past Surgical History  Procedure Date  . Wisdom tooth extraction   . Tonsillectomy     age 29  . Tubal ligation 9 yrs ago    [name]  . Endometrial ablation     Family History  Problem Relation Age of Onset  . Arthritis Maternal Aunt   . Gallbladder disease Maternal Aunt   . Arthritis Paternal Aunt   . Arthritis Maternal Grandmother   . Arthritis Maternal Grandfather   . Cancer Maternal Grandfather     leukemia, colon  . Diabetes Maternal Grandfather   . Arthritis Paternal Grandfather   . Asthma Paternal Grandfather   . COPD Paternal Grandfather   . Hyperlipidemia Mother   . Hypertension Mother   . Anesthesia problems Neg Hx   . Hypotension Neg Hx   . Malignant hyperthermia Neg Hx     . Pseudochol deficiency Neg Hx   . Heart disease Neg Hx   . Stroke Neg Hx     History   Social History  . Marital Status: Married    Spouse Name: N/A    Number of Children: N/A  . Years of Education: N/A   Occupational History  . planning and Furniture conservator/restorer    Social History Main Topics  . Smoking status: Never Smoker   . Smokeless tobacco: Not on file  . Alcohol Use: 6.0 oz/week    10 Glasses of wine per week  . Drug Use: No  . Sexually Active: Yes    Birth Control/ Protection: Surgical   Other Topics Concern  . Not on file   Social History Narrative   Exercise with walking, hand weight training, separated, no children    Current Outpatient Prescriptions on File Prior to Visit  Medication Sig Dispense Refill  . Estradiol (ELESTRIN) 0.52 MG/0.87 GM (0.06%) GEL Apply 1 application topically daily.         No Known Allergies   Review of Systems Constitutional: -fever, -chills, -sweats, -unexpected weight change, -anorexia, -fatigue Allergy: -sneezing, -itching, -congestion Dermatology:+ changing moles, rash, lumps, new worrisome lesions ENT: -runny nose, -ear pain, -sore throat, -hoarseness, -sinus pain, -teeth pain, -tinnitus, -hearing loss, -epistaxis Cardiology:  +chest  pain, -palpitations, -edema, -orthopnea, -paroxysmal nocturnal dyspnea Respiratory: -cough, -shortness of breath, -dyspnea on exertion, -wheezing, -hemoptysis Gastroenterology: +abdominal pain, -nausea, -vomiting, -diarrhea, -constipation, -blood in stool, -changes in bowel movement, -dysphagia Hematology: -bleeding or bruising problems Musculoskeletal: -arthralgias, -myalgias, -joint swelling, -back pain, -neck pain, -cramping, -gait changes Ophthalmology: -vision changes, -eye redness, -itching, -discharge Urology: -dysuria, -difficulty urinating, -hematuria, -urinary frequency, -urgency, incontinence Neurology: -headache, -weakness, -tingling, -numbness, -speech abnormality, -memory loss,  -falls, -dizziness Psychology:  -depressed mood, -agitation, -sleep problems     Objective:   Physical Exam  Filed Vitals:   05/14/12 0813  BP: 128/80  Pulse: 79  Temp: 97.8 F (36.6 C)  Resp: 14    General appearance: alert, no distress, WD/WN, lean white female Skin: right anterior upper thigh distally with roundish 3mm pink lesion suggestive of actinic keratosis, several other benign scattered flat macules throughout, freckles throughout, tanned skin from once weekly tanning bed usage HEENT: normocephalic, conjunctiva/corneas normal, sclerae anicteric, PERRLA, EOMi, nares patent, no discharge or erythema, pharynx normal Oral cavity: MMM, tongue normal, teeth in good repair Neck: supple, no lymphadenopathy, no thyromegaly, no masses, normal ROM, no bruits Chest: mild tenderness of left chest wall inferiorly, left of sternum, on obvious abnormality, normal shape and expansion Heart: RRR, normal S1, S2, no murmurs Lungs: CTA bilaterally, no wheezes, rhonchi, or rales Abdomen: +bs, soft,mild generalized tenderness, non distended, no masses, no hepatomegaly, no splenomegaly, no bruits Back: non tender, normal ROM, no scoliosis Musculoskeletal: upper extremities non tender, no obvious deformity, normal ROM throughout, lower extremities non tender, no obvious deformity, normal ROM throughout Extremities: no edema, no cyanosis, no clubbing Pulses: 2+ symmetric, upper and lower extremities, normal cap refill Neurological: alert, oriented x 3, CN2-12 intact, strength normal upper extremities and lower extremities, sensation normal throughout, DTRs 2+ throughout, no cerebellar signs, gait normal Psychiatric: normal affect, behavior normal, pleasant  Breast/gyn/rectal - deferred to gynecology   Adult ECG Report  Indication: chest pain  Rate: 68bpm  Rhythm: normal sinus rhythm  QRS Axis: 71 degrees  PR Interval:  QRS Duration: 98ms  QTc:  Conduction Disturbances: RSR' in  V1, V2  Other Abnormalities: none  Patient's cardiac risk factors are: none.  EKG comparison: none  Narrative Interpretation: RSR in V1, V2, c/w right ventricular conduction delay, otherwise unremarkable EKG   CXR - no obvious mass, pneumonia or bony abnormality, no acute changes.     Assessment and Plan :    Encounter Diagnoses  Name Primary?  . Routine general medical examination at a health care facility Yes  . Chest pain   . Screen for colon cancer   . Need for Tdap vaccination    Physical exam - discussed healthy lifestyle, diet, exercise, preventative care, vaccinations, and addressed their concerns.  Handout given.  Chest pain - reviewed EKG and CXR, will send for labs  Screen for colon cancer - referral to Dr. Karilyn Cota for first screening colonoscopy  Advised tdap booster today but she declines.    Follow-up pending labs

## 2012-05-14 NOTE — Patient Instructions (Signed)

## 2012-05-20 ENCOUNTER — Telehealth: Payer: Self-pay | Admitting: Medical

## 2012-05-25 NOTE — Telephone Encounter (Signed)
error 

## 2012-06-06 ENCOUNTER — Encounter (INDEPENDENT_AMBULATORY_CARE_PROVIDER_SITE_OTHER): Payer: Self-pay | Admitting: *Deleted

## 2012-06-06 ENCOUNTER — Telehealth (INDEPENDENT_AMBULATORY_CARE_PROVIDER_SITE_OTHER): Payer: Self-pay | Admitting: *Deleted

## 2012-06-06 ENCOUNTER — Other Ambulatory Visit (INDEPENDENT_AMBULATORY_CARE_PROVIDER_SITE_OTHER): Payer: Self-pay | Admitting: *Deleted

## 2012-06-06 DIAGNOSIS — Z1211 Encounter for screening for malignant neoplasm of colon: Secondary | ICD-10-CM

## 2012-06-06 NOTE — Telephone Encounter (Signed)
Patient needs movi prep 

## 2012-06-07 MED ORDER — PEG-KCL-NACL-NASULF-NA ASC-C 100 G PO SOLR: 1.0000 | Freq: Once | ORAL | Status: DC

## 2012-07-24 ENCOUNTER — Encounter (HOSPITAL_COMMUNITY): Payer: Self-pay | Admitting: Pharmacy Technician

## 2012-07-24 ENCOUNTER — Telehealth (INDEPENDENT_AMBULATORY_CARE_PROVIDER_SITE_OTHER): Payer: Self-pay | Admitting: *Deleted

## 2012-07-24 NOTE — Telephone Encounter (Signed)
PCP/Requesting MD: Sharlot Gowda  Name & DOB: Marcial Pacas Aug 02, 1961   Procedure: tcs  Reason/Indication:  screening  Has patient had this procedure before?  no  If so, when, by whom and where?    Is there a family history of colon cancer?  no  Who?  What age when diagnosed?    Is patient diabetic?   no      Does patient have prosthetic heart valve?  no  Do you have a pacemaker?  no  Has patient had joint replacement within last 12 months?  no  Is patient on Coumadin, Plavix and/or Aspirin? no  Medications: progesterone 200 mg daily, elestrin gel daily  Allergies: nkda  Medication Adjustment:   Procedure date & time: 08/07/12 at 930

## 2012-07-25 NOTE — Telephone Encounter (Signed)
agree

## 2012-08-07 ENCOUNTER — Encounter (HOSPITAL_COMMUNITY): Payer: Self-pay | Admitting: *Deleted

## 2012-08-07 ENCOUNTER — Ambulatory Visit (HOSPITAL_COMMUNITY)
Admission: RE | Admit: 2012-08-07 | Discharge: 2012-08-07 | Disposition: A | Discharge status: Home Health | Payer: BC Managed Care – PPO | Source: Ambulatory Visit | Attending: Internal Medicine | Admitting: Internal Medicine

## 2012-08-07 ENCOUNTER — Encounter (HOSPITAL_COMMUNITY)
Admission: RE | Disposition: A | Discharge status: Home Health | Payer: Self-pay | Source: Ambulatory Visit | Attending: Internal Medicine

## 2012-08-07 DIAGNOSIS — Z1211 Encounter for screening for malignant neoplasm of colon: Secondary | ICD-10-CM | POA: Insufficient documentation

## 2012-08-07 DIAGNOSIS — K6389 Other specified diseases of intestine: Secondary | ICD-10-CM

## 2012-08-07 DIAGNOSIS — Z8371 Family history of colonic polyps: Secondary | ICD-10-CM | POA: Insufficient documentation

## 2012-08-07 DIAGNOSIS — K644 Residual hemorrhoidal skin tags: Secondary | ICD-10-CM | POA: Insufficient documentation

## 2012-08-07 DIAGNOSIS — Z83719 Family history of colon polyps, unspecified: Secondary | ICD-10-CM | POA: Insufficient documentation

## 2012-08-07 HISTORY — DX: Unspecified right bundle-branch block: I45.10

## 2012-08-07 HISTORY — PX: COLONOSCOPY: SHX5424

## 2012-08-07 SURGERY — COLONOSCOPY
Anesthesia: Moderate Sedation

## 2012-08-07 MED ORDER — MIDAZOLAM HCL 5 MG/5ML IJ SOLN
INTRAMUSCULAR | Status: AC
  Filled 2012-08-07: qty 10

## 2012-08-07 MED ORDER — MEPERIDINE HCL 50 MG/ML IJ SOLN
INTRAMUSCULAR | Status: DC | PRN
  Administered 2012-08-07 (×2): 25 mg via INTRAVENOUS

## 2012-08-07 MED ORDER — SODIUM CHLORIDE 0.45 % IV SOLN
INTRAVENOUS | Status: DC
  Administered 2012-08-07: 09:00:00 via INTRAVENOUS

## 2012-08-07 MED ORDER — MEPERIDINE HCL 50 MG/ML IJ SOLN
INTRAMUSCULAR | Status: AC
  Filled 2012-08-07: qty 1

## 2012-08-07 MED ORDER — STERILE WATER FOR IRRIGATION IR SOLN
Status: DC | PRN
  Administered 2012-08-07: 09:00:00

## 2012-08-07 MED ORDER — MIDAZOLAM HCL 5 MG/5ML IJ SOLN
INTRAMUSCULAR | Status: DC | PRN
  Administered 2012-08-07 (×3): 2 mg via INTRAVENOUS
  Administered 2012-08-07 (×2): 1 mg via INTRAVENOUS
  Administered 2012-08-07: 2 mg via INTRAVENOUS

## 2012-08-07 NOTE — Op Note (Signed)
COLONOSCOPY PROCEDURE REPORT  PATIENT:  Rebecca [name]  MR#:  [id number] Birthdate:  07-Mar-1961, 52 y.o., female Endoscopist:  Dr. Malissa Hippo, MD Referred By:  Dr. Carollee Herter, MD Procedure Date: 08/07/2012  Procedure:   Colonoscopy  Indications:  Patient is 51 year old Caucasian female was undergoing screening colonoscopy. Family history is negative for colorectal carcinoma but her mother had adenomas at age 23.  Informed Consent:  The procedure and risks were reviewed with the patient and informed consent was obtained.  Medications:  Demerol 50 mg IV Versed 10 mg IV  Description of procedure:  After a digital rectal exam was performed, that colonoscope was advanced from the anus through the rectum and colon to the area of the cecum, ileocecal valve and appendiceal orifice. The cecum was deeply intubated. These structures were well-seen and photographed for the record. From the level of the cecum and ileocecal valve, the scope was slowly and cautiously withdrawn. The mucosal surfaces were carefully surveyed utilizing scope tip to flexion to facilitate fold flattening as needed. The scope was pulled down into the rectum where a thorough exam including retroflexion was performed. Terminal ileum was also examined.   Findings:   Prep excellent. Normal terminal ileum. Normal mucosa of the colon throughout. Normal rectal mucosa. Small hemorrhoids below the dentate line and single anal papilla.  Therapeutic/Diagnostic Maneuvers Performed:  None  Complications:  None  Cecal Withdrawal Time:  10  minutes  Impression:  Normal terminal ileum. Normal colonoscopy except small external hemorrhoids and single anal papilla.  Recommendations:  Standard instructions given. Next screening exam in 10 years.  Jennalynn Rivard U  08/07/2012 10:02 AM  CC: Dr. Carollee Herter, MD & Dr. Bonnetta Barry ref. provider found

## 2012-08-07 NOTE — H&P (Signed)
Rebecca [name] is an 51 y.o. female.   Chief Complaint: Patient is here for colonoscopy. HPI: Patient is 51 year old Caucasian female who is in for screening colonoscopy. She denies change in bowel habits rectal bleeding or abdominal pain. Family history is significant for colonic adenomas and her mother just had her first colonoscopy at age 60. Family history is negative for CRC.  Past Medical History  Diagnosis Date  . Herpes simplex     oral  . Allergy   . Chronic headache     associated with tension or sinus problems  . H/O mammogram 03/2012    sees gynecology  . Pap smear for cervical cancer screening 02/2012    Dr. Despina [name], [facility]  . Right bundle branch block     Past Surgical History  Procedure Date  . Wisdom tooth extraction   . Tonsillectomy     age 54  . Tubal ligation 9 yrs ago    Rebecca [name]  . Endometrial ablation     Family History  Problem Relation Age of Onset  . Arthritis Maternal Aunt   . Gallbladder disease Maternal Aunt   . Arthritis Paternal Aunt   . Arthritis Maternal Grandmother   . Arthritis Maternal Grandfather   . Cancer Maternal Grandfather     leukemia, colon  . Diabetes Maternal Grandfather   . Colon cancer Maternal Grandfather   . Arthritis Paternal Grandfather   . Asthma Paternal Grandfather   . COPD Paternal Grandfather   . Hyperlipidemia Mother   . Hypertension Mother   . Anesthesia problems Neg Hx   . Hypotension Neg Hx   . Malignant hyperthermia Neg Hx   . Pseudochol deficiency Neg Hx   . Heart disease Neg Hx   . Stroke Neg Hx    Social History:  reports that she has never smoked. She does not have any smokeless tobacco history on file. She reports that she drinks about 4.2 ounces of alcohol per week. She reports that she does not use illicit drugs.  Allergies: No Known Allergies  Medications Prior to Admission  Medication Sig Dispense Refill  . Estradiol (ELESTRIN) 0.52 MG/0.87 GM (0.06%) GEL Apply 1 application topically  daily.       . peg 3350 powder (MOVIPREP) 100 G SOLR Take 1 kit (100 g total) by mouth once.  1 kit  0  . progesterone (PROMETRIUM) 100 MG capsule Take 100 mg by mouth daily.        No results found for this or any previous visit (from the past 48 hour(s)). No results found.  ROS  Blood pressure 130/84, pulse 77, temperature 97.5 F (36.4 C), temperature source Oral, resp. rate 16, height 5\' 6"  (1.676 m), weight 146 lb (66.225 kg), SpO2 100.00%. Physical Exam  Constitutional: She appears well-developed and well-nourished.  HENT:  Mouth/Throat: Oropharynx is clear and moist.  Eyes: Conjunctivae normal are normal. No scleral icterus.  Neck: No thyromegaly present.  Cardiovascular: Normal rate, regular rhythm and normal heart sounds.   No murmur heard. Respiratory: Effort normal and breath sounds normal.  GI: Soft. She exhibits no distension and no mass. There is no tenderness.  Musculoskeletal: She exhibits no edema.  Lymphadenopathy:    She has no cervical adenopathy.  Neurological: She is alert.  Skin: Skin is warm and dry.     Assessment/Plan Average risk screening colonoscopy.  Rebecca [name] 08/07/2012, 9:23 AM

## 2012-08-14 ENCOUNTER — Encounter (HOSPITAL_COMMUNITY): Payer: Self-pay | Admitting: Internal Medicine

## 2013-02-12 ENCOUNTER — Other Ambulatory Visit: Payer: Self-pay

## 2013-02-12 DIAGNOSIS — Z1231 Encounter for screening mammogram for malignant neoplasm of breast: Secondary | ICD-10-CM

## 2013-03-17 ENCOUNTER — Encounter: Payer: Self-pay | Admitting: *Deleted

## 2013-03-18 ENCOUNTER — Encounter: Payer: Self-pay | Admitting: Obstetrics & Gynecology

## 2013-03-18 ENCOUNTER — Ambulatory Visit (INDEPENDENT_AMBULATORY_CARE_PROVIDER_SITE_OTHER): Payer: BC Managed Care – PPO | Admitting: Obstetrics & Gynecology

## 2013-03-18 VITALS — BP 120/80 | Ht 66.0 in | Wt 151.0 lb

## 2013-03-18 DIAGNOSIS — Z01419 Encounter for gynecological examination (general) (routine) without abnormal findings: Secondary | ICD-10-CM

## 2013-03-18 DIAGNOSIS — Z1212 Encounter for screening for malignant neoplasm of rectum: Secondary | ICD-10-CM

## 2013-03-18 MED ORDER — PROGESTERONE MICRONIZED 100 MG PO CAPS: 100.0000 mg | ORAL_CAPSULE | Freq: Every day | ORAL | Status: DC

## 2013-03-18 MED ORDER — ESTRADIOL 0.52 MG/0.87 GM (0.06%) TD GEL: 1.0000 "application " | Freq: Every day | TRANSDERMAL | Status: DC

## 2013-03-18 NOTE — Progress Notes (Signed)
Patient ID: Rebecca [name], female   DOB: 17-Jun-1961, 52 y.o.   MRN: [id number] Subjective:     Rebecca [name] is a 52 y.o. female here for a routine exam.  No LMP recorded. Patient has had an ablation. No obstetric history on file. Current complaints: none.  Personal health questionnaire reviewed: yes.   Gynecologic History No LMP recorded. Patient has had an ablation. Contraception: post menopausal status Last Pap: 2013. Results were: normal Last mammogram: 2013. Results were: normal  Obstetric History OB History   Grav Para Term Preterm Abortions TAB SAB Ect Mult Living                   The following portions of the patient's history were reviewed and updated as appropriate: allergies, current medications, past family history, past social history, past surgical history and problem list.  Review of Systems  Review of Systems  Constitutional: Negative for fever, chills, weight loss, malaise/fatigue and diaphoresis.  HENT: Negative for hearing loss, ear pain, nosebleeds, congestion, sore throat, neck pain, tinnitus and ear discharge.   Eyes: Negative for blurred vision, double vision, photophobia, pain, discharge and redness.  Respiratory: Negative for cough, hemoptysis, sputum production, shortness of breath, wheezing and stridor.   Cardiovascular: Negative for chest pain, palpitations, orthopnea, claudication, leg swelling and PND.  Gastrointestinal: negative for abdominal pain. Negative for heartburn, nausea, vomiting, diarrhea, constipation, blood in stool and melena.  Genitourinary: Negative for dysuria, urgency, frequency, hematuria and flank pain.  Musculoskeletal: Negative for myalgias, back pain, joint pain and falls.  Skin: Negative for itching and rash.  Neurological: Negative for dizziness, tingling, tremors, sensory change, speech change, focal weakness, seizures, loss of consciousness, weakness and headaches.  Endo/Heme/Allergies: Negative for environmental  allergies and polydipsia. Does not bruise/bleed easily.  Psychiatric/Behavioral: Negative for depression, suicidal ideas, hallucinations, memory loss and substance abuse. The patient is not nervous/anxious and does not have insomnia.        Objective:    Physical Exam  Vitals reviewed. Constitutional: She is oriented to person, place, and time. She appears well-developed and well-nourished.  HENT:  Head: Normocephalic and atraumatic.        Right Ear: External ear normal.  Left Ear: External ear normal.  Nose: Nose normal.  Mouth/Throat: Oropharynx is clear and moist.  Eyes: Conjunctivae and EOM are normal. Pupils are equal, round, and reactive to light. Right eye exhibits no discharge. Left eye exhibits no discharge. No scleral icterus.  Neck: Normal range of motion. Neck supple. No tracheal deviation present. No thyromegaly present.  Cardiovascular: Normal rate, regular rhythm, normal heart sounds and intact distal pulses.  Exam reveals no gallop and no friction rub.   No murmur heard. Respiratory: Effort normal and breath sounds normal. No respiratory distress. She has no wheezes. She has no rales. She exhibits no tenderness.  GI: Soft. Bowel sounds are normal. She exhibits no distension and no mass. There is no tenderness. There is no rebound and no guarding.  Genitourinary:       Vulva is normal without lesions Vagina is pink moist without discharge Cervix normal in appearance and pap is done Uterus is normal size shape and contour Adnexa is negative with normal sized ovaries   Musculoskeletal: Normal range of motion. She exhibits no edema and no tenderness.  Neurological: She is alert and oriented to person, place, and time. She has normal reflexes. She displays normal reflexes. No cranial nerve deficit. She exhibits normal muscle tone. Coordination normal.  Skin: Skin is warm and dry. No rash noted. No erythema. No pallor.  Psychiatric: She has a normal mood and affect. Her  behavior is normal. Judgment and thought content normal.       Assessment:    Healthy female exam.    Plan:    Hormone replacement therapy: hormone replacement therapy: Elestrin. Mammogram ordered. Follow up in: 1 year.

## 2013-03-26 ENCOUNTER — Ambulatory Visit
Admission: RE | Admit: 2013-03-26 | Discharge: 2013-03-26 | Disposition: A | Discharge status: Home / Self Care | Payer: BC Managed Care – PPO | Source: Ambulatory Visit

## 2013-03-26 DIAGNOSIS — Z1231 Encounter for screening mammogram for malignant neoplasm of breast: Secondary | ICD-10-CM

## 2013-12-29 ENCOUNTER — Other Ambulatory Visit: Payer: Self-pay | Admitting: Obstetrics & Gynecology

## 2014-03-23 ENCOUNTER — Ambulatory Visit (INDEPENDENT_AMBULATORY_CARE_PROVIDER_SITE_OTHER): Payer: BC Managed Care – PPO | Admitting: Obstetrics & Gynecology

## 2014-03-23 ENCOUNTER — Other Ambulatory Visit (HOSPITAL_COMMUNITY)
Admission: RE | Admit: 2014-03-23 | Discharge: 2014-03-23 | Disposition: A | Discharge status: Home / Self Care | Payer: BC Managed Care – PPO | Source: Ambulatory Visit | Attending: Obstetrics & Gynecology | Admitting: Obstetrics & Gynecology

## 2014-03-23 ENCOUNTER — Encounter: Payer: Self-pay | Admitting: Obstetrics & Gynecology

## 2014-03-23 VITALS — BP 120/80 | Ht 66.0 in | Wt 151.4 lb

## 2014-03-23 DIAGNOSIS — Z01419 Encounter for gynecological examination (general) (routine) without abnormal findings: Secondary | ICD-10-CM | POA: Insufficient documentation

## 2014-03-23 DIAGNOSIS — Z1151 Encounter for screening for human papillomavirus (HPV): Secondary | ICD-10-CM | POA: Insufficient documentation

## 2014-03-23 MED ORDER — ESTRADIOL 0.52 MG/0.87 GM (0.06%) TD GEL: 2.0000 "application " | Freq: Every day | TRANSDERMAL | Status: DC

## 2014-03-23 MED ORDER — PROGESTERONE MICRONIZED 200 MG PO CAPS: ORAL_CAPSULE | ORAL | Status: DC

## 2014-03-23 NOTE — Addendum Note (Signed)
Addended by: Richardson Chiquito on: 03/23/2014 04:16 PM   Modules accepted: Orders

## 2014-03-23 NOTE — Progress Notes (Signed)
Patient ID: Rebecca [name], female   DOB: 12-21-1960, 53 y.o.   MRN: [id number] Subjective:     Rebecca [name] is a 53 y.o. female here for a routine exam.  No LMP recorded. Patient has had an ablation. No obstetric history on file. Birth Control Method:  na Menstrual Calendar(currently): amenorrheic  Current complaints: none.   Current acute medical issues:  none   Recent Gynecologic History No LMP recorded. Patient has had an ablation. Last Pap: 2014,  normal Last mammogram: 2014,  normal  Past Medical History  Diagnosis Date  . Herpes simplex     oral  . Allergy   . Chronic headache     associated with tension or sinus problems  . H/O mammogram 03/2012    sees gynecology  . Pap smear for cervical cancer screening 02/2012    [place]. Despina [place]ure, [place]  . Right bundle branch block   . Abnormal Pap smear     Past Surgical History  Procedure Laterality Date  . Wisdom tooth extraction    . Tonsillectomy      age 89  . Tubal ligation  9 yrs ago    [facility]  . Endometrial ablation    . Colonoscopy  08/07/2012    Procedure: COLONOSCOPY;  Surgeon: Malissa HippoNajeeb U Rehman, MD;  Location: [facility];  Service: Endoscopy;  Laterality: N/A;  930    OB History   Grav Para Term Preterm Abortions TAB SAB Ect Mult Living                  History   Social History  . Marital Status: Legally Separated    Spouse Name: N/A    Number of Children: N/A  . Years of Education: N/A   Occupational History  . 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 Smoking status: Never Smoker   . Smokeless tobacco: Never Used  . Alcohol Use: 4.2 oz/week    7 Glasses of wine per week  . Drug Use: No  . Sexual Activity: Yes    Birth Control/ Protection: Surgical   Other Topics Concern  . None   Social History Narrative   Exercise with walking, hand weight training, separated, no children    Family History  Problem Relation Age of Onset  . Arthritis Maternal Aunt   .  Gallbladder disease Maternal Aunt   . Arthritis Paternal Aunt   . Arthritis Maternal Grandmother   . Arthritis Maternal Grandfather   . Cancer Maternal Grandfather     leukemia, colon  . Diabetes Maternal Grandfather   . Colon cancer Maternal Grandfather   . Arthritis Paternal Grandfather   . Asthma Paternal Grandfather   . COPD Paternal Grandfather   . Hyperlipidemia Mother   . Hypertension Mother   . Anesthesia problems Neg Hx   . Hypotension Neg Hx   . Malignant hyperthermia Neg Hx   . Pseudochol deficiency Neg Hx   . Heart disease Neg Hx   . Stroke Neg Hx      Review of Systems  Review of Systems  Constitutional: Negative for fever, chills, weight loss, malaise/fatigue and diaphoresis.  HENT: Negative for hearing loss, ear pain, nosebleeds, congestion, sore throat, neck pain, tinnitus and ear discharge.   Eyes: Negative for blurred vision, double vision, photophobia, pain, discharge and redness.  Respiratory: Negative for cough, hemoptysis, sputum production, shortness of breath, wheezing and stridor.   Cardiovascular: Negative for chest pain, palpitations, orthopnea, claudication,  leg swelling and PND.  Gastrointestinal: negative for abdominal pain. Negative for heartburn, nausea, vomiting, diarrhea, constipation, blood in stool and melena.  Genitourinary: Negative for dysuria, urgency, frequency, hematuria and flank pain.  Musculoskeletal: Negative for myalgias, back pain, joint pain and falls.  Skin: Negative for itching and rash.  Neurological: Negative for dizziness, tingling, tremors, sensory change, speech change, focal weakness, seizures, loss of consciousness, weakness and headaches.  Endo/Heme/Allergies: Negative for environmental allergies and polydipsia. Does not bruise/bleed easily.  Psychiatric/Behavioral: Negative for depression, suicidal ideas, hallucinations, memory loss and substance abuse. The patient is not nervous/anxious and does not have insomnia.         Objective:    Physical Exam  Vitals reviewed. Constitutional: She is oriented to person, place, and time. She appears well-developed and well-nourished.  HENT:  Head: Normocephalic and atraumatic.        Right Ear: External ear normal.  Left Ear: External ear normal.  Nose: Nose normal.  Mouth/Throat: Oropharynx is clear and moist.  Eyes: Conjunctivae and EOM are normal. Pupils are equal, round, and reactive to light. Right eye exhibits no discharge. Left eye exhibits no discharge. No scleral icterus.  Neck: Normal range of motion. Neck supple. No tracheal deviation present. No thyromegaly present.  Cardiovascular: Normal rate, regular rhythm, normal heart sounds and intact distal pulses.  Exam reveals no gallop and no friction rub.   No murmur heard. Respiratory: Effort normal and breath sounds normal. No respiratory distress. She has no wheezes. She has no rales. She exhibits no tenderness.  GI: Soft. Bowel sounds are normal. She exhibits no distension and no mass. There is no tenderness. There is no rebound and no guarding.  Genitourinary:  Breasts no masses skin changes or nipple changes bilaterally      Vulva is normal without lesions Vagina is pink moist without discharge Cervix normal in appearance and pap is done Uterus is normal size shape and contour Adnexa is negative with normal sized ovaries   Musculoskeletal: Normal range of motion. She exhibits no edema and no tenderness.  Neurological: She is alert and oriented to person, place, and time. She has normal reflexes. She displays normal reflexes. No cranial nerve deficit. She exhibits normal muscle tone. Coordination normal.  Skin: Skin is warm and dry. No rash noted. No erythema. No pallor.  Psychiatric: She has a normal mood and affect. Her behavior is normal. Judgment and thought content normal.       Assessment:    Healthy female exam.    Plan:    Hormone replacement therapy: hormone replacement therapy:  elestrin. Mammogram ordered. Follow up in: 1 year.

## 2014-03-25 LAB — CYTOLOGY - PAP

## 2014-07-23 ENCOUNTER — Other Ambulatory Visit: Payer: Self-pay | Admitting: Obstetrics & Gynecology

## 2014-07-23 MED ORDER — ESTRADIOL 0.52 MG/0.87 GM (0.06%) TD GEL: 2.0000 "application " | Freq: Every day | TRANSDERMAL | Status: DC

## 2014-08-04 ENCOUNTER — Other Ambulatory Visit: Payer: Self-pay | Admitting: *Deleted

## 2014-08-04 MED ORDER — ESTRADIOL 0.52 MG/0.87 GM (0.06%) TD GEL: 2.0000 "application " | Freq: Every day | TRANSDERMAL | Status: DC

## 2015-03-29 ENCOUNTER — Ambulatory Visit (INDEPENDENT_AMBULATORY_CARE_PROVIDER_SITE_OTHER): Payer: BLUE CROSS/BLUE SHIELD | Admitting: Obstetrics & Gynecology

## 2015-03-29 ENCOUNTER — Encounter: Payer: Self-pay | Admitting: Obstetrics & Gynecology

## 2015-03-29 ENCOUNTER — Other Ambulatory Visit (HOSPITAL_COMMUNITY)
Admission: RE | Admit: 2015-03-29 | Discharge: 2015-03-29 | Disposition: A | Discharge status: Home / Self Care | Payer: BLUE CROSS/BLUE SHIELD | Source: Ambulatory Visit | Attending: Obstetrics & Gynecology | Admitting: Obstetrics & Gynecology

## 2015-03-29 VITALS — BP 120/80 | HR 76 | Ht 66.0 in | Wt 151.0 lb

## 2015-03-29 DIAGNOSIS — Z01419 Encounter for gynecological examination (general) (routine) without abnormal findings: Secondary | ICD-10-CM | POA: Diagnosis not present

## 2015-03-29 MED ORDER — ESTRADIOL 0.52 MG/0.87 GM (0.06%) TD GEL: 2.0000 "application " | Freq: Every day | TRANSDERMAL | Status: DC

## 2015-03-29 MED ORDER — PROGESTERONE MICRONIZED 100 MG PO CAPS: 100.0000 mg | ORAL_CAPSULE | Freq: Every day | ORAL | Status: DC

## 2015-03-29 NOTE — Progress Notes (Signed)
Patient ID: Rebecca [name], female   DOB: 22-Nov-1960, 54 y.o.   MRN: [id number] Subjective:     Rebecca [name] is a 54 y.o. female here for a routine exam.  No LMP recorded. Patient has had an ablation. No obstetric history on file. Birth Control Method:  Post menopausal Menstrual Calendar(currently): amenorrhiec  Current complaints: none.   Current acute medical issues:  none   Recent Gynecologic History No LMP recorded. Patient has had an ablation. Last Pap: 2015,  normal Last mammogram: 2014,  normal  Past Medical History  Diagnosis Date  . Herpes simplex     oral  . Allergy   . Chronic headache     associated with tension or sinus problems  . H/O mammogram 03/2012    sees gynecology  . Pap smear for cervical cancer screening 02/2012    Dr. Despina Hidden, [place]  . Right bundle branch block   . Abnormal Pap smear     Past Surgical History  Procedure Laterality Date  . Wisdom tooth extraction    . Tonsillectomy      age 37  . Tubal ligation  9 yrs ago    [name]  . Endometrial ablation    . Colonoscopy  08/07/2012    Procedure: COLONOSCOPY;  Surgeon: Malissa Hippo, MD;  Location: [facility];  Service: Endoscopy;  Laterality: N/A;  930    OB History    No data available      History   Social History  . Marital Status: Legally Separated    Spouse Name: N/A  . Number of Children: N/A  . Years of Education: N/A   Occupational History  . planning and Furniture conservator/restorer    Social History Main Topics  . Smoking status: Never Smoker   . Smokeless tobacco: Never Used  . Alcohol Use: 4.2 oz/week    7 Glasses of wine per week  . Drug Use: No  . Sexual Activity: Yes    Birth Control/ Protection: Surgical   Other Topics Concern  . None   Social History Narrative   Exercise with walking, hand weight training, separated, no children    Family History  Problem Relation Age of Onset  . Arthritis Maternal Aunt   . Gallbladder disease Maternal Aunt   .  Arthritis Paternal Aunt   . Arthritis Maternal Grandmother   . Arthritis Maternal Grandfather   . Cancer Maternal Grandfather     leukemia, colon  . Diabetes Maternal Grandfather   . Colon cancer Maternal Grandfather   . Arthritis Paternal Grandfather   . Asthma Paternal Grandfather   . COPD Paternal Grandfather   . Hyperlipidemia Mother   . Hypertension Mother   . Anesthesia problems Neg Hx   . Hypotension Neg Hx   . Malignant hyperthermia Neg Hx   . Pseudochol deficiency Neg Hx   . Heart disease Neg Hx   . Stroke Neg Hx      Current outpatient prescriptions:  .  Estradiol (ELESTRIN) 0.52 MG/0.87 GM (0.06%) GEL, Apply 2 application topically daily., Disp: 52 g, Rfl: 11 .  progesterone (PROMETRIUM) 100 MG capsule, Take 1 capsule (100 mg total) by mouth daily., Disp: 30 capsule, Rfl: 11 .  progesterone (PROMETRIUM) 200 MG capsule, TAKE TWO CAPSULES EACH DAY (Patient not taking: Reported on 03/29/2015), Disp: 60 capsule, Rfl: 11  Review of Systems  Review of Systems  Constitutional: Negative for fever, chills, weight loss, malaise/fatigue and diaphoresis.  HENT: Negative for hearing loss,  ear pain, nosebleeds, congestion, sore throat, neck pain, tinnitus and ear discharge.   Eyes: Negative for blurred vision, double vision, photophobia, pain, discharge and redness.  Respiratory: Negative for cough, hemoptysis, sputum production, shortness of breath, wheezing and stridor.   Cardiovascular: Negative for chest pain, palpitations, orthopnea, claudication, leg swelling and PND.  Gastrointestinal: negative for abdominal pain. Negative for heartburn, nausea, vomiting, diarrhea, constipation, blood in stool and melena.  Genitourinary: Negative for dysuria, urgency, frequency, hematuria and flank pain.  Musculoskeletal: Negative for myalgias, back pain, joint pain and falls.  Skin: Negative for itching and rash.  Neurological: Negative for dizziness, tingling, tremors, sensory change,  speech change, focal weakness, seizures, loss of consciousness, weakness and headaches.  Endo/Heme/Allergies: Negative for environmental allergies and polydipsia. Does not bruise/bleed easily.  Psychiatric/Behavioral: Negative for depression, suicidal ideas, hallucinations, memory loss and substance abuse. The patient is not nervous/anxious and does not have insomnia.        Objective:  Blood pressure 120/80, pulse 76, height  (1.676 m), weight 151 lb (68.493 kg).   Physical Exam  Vitals reviewed. Constitutional: She is oriented to person, place, and time. She appears well-developed and well-nourished.  HENT:  Head: Normocephalic and atraumatic.        Right Ear: External ear normal.  Left Ear: External ear normal.  Nose: Nose normal.  Mouth/Throat: Oropharynx is clear and moist.  Eyes: Conjunctivae and EOM are normal. Pupils are equal, round, and reactive to light. Right eye exhibits no discharge. Left eye exhibits no discharge. No scleral icterus.  Neck: Normal range of motion. Neck supple. No tracheal deviation present. No thyromegaly present.  Cardiovascular: Normal rate, regular rhythm, normal heart sounds and intact distal pulses.  Exam reveals no gallop and no friction rub.   No murmur heard. Respiratory: Effort normal and breath sounds normal. No respiratory distress. She has no wheezes. She has no rales. She exhibits no tenderness.  GI: Soft. Bowel sounds are normal. She exhibits no distension and no mass. There is no tenderness. There is no rebound and no guarding.  Genitourinary:  Breasts no masses skin changes or nipple changes bilaterally      Vulva is normal without lesions Vagina is pink moist without discharge Cervix normal in appearance and pap is done Uterus is normal size shape and contour Adnexa is negative with normal sized ovaries  {Rectal    hemoccult negative, normal tone, no masses Musculoskeletal: Normal range of motion. She exhibits no edema and no  tenderness.  Neurological: She is alert and oriented to person, place, and time. She has normal reflexes. She displays normal reflexes. No cranial nerve deficit. She exhibits normal muscle tone. Coordination normal.  Skin: Skin is warm and dry. No rash noted. No erythema. No pallor.  Psychiatric: She has a normal mood and affect. Her behavior is normal. Judgment and thought content normal.       Assessment:    Healthy female exam.    Plan:    Hormone replacement therapy: hormone replacement therapy: elestrin and prometrium. Mammogram ordered. Follow up in: 1 year.

## 2015-03-31 ENCOUNTER — Other Ambulatory Visit: Payer: Self-pay

## 2015-03-31 DIAGNOSIS — Z1231 Encounter for screening mammogram for malignant neoplasm of breast: Secondary | ICD-10-CM

## 2015-03-31 LAB — CYTOLOGY - PAP

## 2015-04-07 ENCOUNTER — Ambulatory Visit
Admission: RE | Admit: 2015-04-07 | Discharge: 2015-04-07 | Disposition: A | Discharge status: Home / Self Care | Payer: BLUE CROSS/BLUE SHIELD | Source: Ambulatory Visit

## 2015-04-07 DIAGNOSIS — Z1231 Encounter for screening mammogram for malignant neoplasm of breast: Secondary | ICD-10-CM

## 2015-06-08 ENCOUNTER — Telehealth: Payer: Self-pay | Admitting: *Deleted

## 2015-06-08 ENCOUNTER — Other Ambulatory Visit: Payer: Self-pay | Admitting: Obstetrics & Gynecology

## 2015-06-08 NOTE — Telephone Encounter (Signed)
Mardelle Matte from [facility] states "pt states was to have a RX for Progesterone 200 mg but pharmacy only has a Rx for Progesterone 100 mg." Did not receive the Rx for Progesterone 200 mg take two capsules each day.   Please verify dosage and frequency?

## 2015-06-08 NOTE — Telephone Encounter (Signed)
Rebecca [name], from [facility] notified pt to take Progesterone 200 mg nightly per Dr.Eure.

## 2015-06-08 NOTE — Telephone Encounter (Signed)
Yes take 200 mg nightly no matter what dose they have available take 200 nightly

## 2016-04-20 ENCOUNTER — Other Ambulatory Visit: Payer: Self-pay | Admitting: Obstetrics & Gynecology

## 2016-05-08 ENCOUNTER — Ambulatory Visit (INDEPENDENT_AMBULATORY_CARE_PROVIDER_SITE_OTHER): Payer: BLUE CROSS/BLUE SHIELD | Admitting: Obstetrics & Gynecology

## 2016-05-08 ENCOUNTER — Other Ambulatory Visit (HOSPITAL_COMMUNITY)
Admission: RE | Admit: 2016-05-08 | Discharge: 2016-05-08 | Disposition: A | Discharge status: Home / Self Care | Payer: BLUE CROSS/BLUE SHIELD | Source: Ambulatory Visit | Attending: Obstetrics & Gynecology | Admitting: Obstetrics & Gynecology

## 2016-05-08 ENCOUNTER — Encounter: Payer: Self-pay | Admitting: Obstetrics & Gynecology

## 2016-05-08 VITALS — BP 128/80 | HR 80 | Ht 66.0 in | Wt 147.0 lb

## 2016-05-08 DIAGNOSIS — Z01419 Encounter for gynecological examination (general) (routine) without abnormal findings: Secondary | ICD-10-CM | POA: Diagnosis not present

## 2016-05-08 MED ORDER — ESTRADIOL 0.52 MG/0.87 GM (0.06%) TD GEL: 2.0000 "application " | Freq: Every day | TRANSDERMAL | 11 refills | Status: DC

## 2016-05-08 MED ORDER — PROGESTERONE MICRONIZED 200 MG PO CAPS: 200.0000 mg | ORAL_CAPSULE | Freq: Every day | ORAL | 11 refills | Status: DC

## 2016-05-08 NOTE — Progress Notes (Signed)
Subjective:     Rebecca [name] is a 55 y.o. female here for a routine exam.  No LMP recorded. Patient has had an ablation. No obstetric history on file. Birth Control Method:  Post menopausal Menstrual Calendar(currently): amenorrheic  Current complaints: hot flushes at night but no night sweats.   Current acute medical issues:  none   Recent Gynecologic History No LMP recorded. Patient has had an ablation. Last Pap: 2016,  normal Last mammogram: 2016,  normal  Past Medical History:  Diagnosis Date  . Abnormal Pap smear   . Allergy   . Chronic headache    associated with tension or sinus problems  . H/O mammogram 03/2012   sees gynecology  . Herpes simplex    oral  . Pap smear for cervical cancer screening 02/2012   Dr. Despina Hidden, [place]  . Right bundle branch block     Past Surgical History:  Procedure Laterality Date  . COLONOSCOPY  08/07/2012   Procedure: COLONOSCOPY;  Surgeon: Malissa Hippo, MD;  Location: [facility];  Service: Endoscopy;  Laterality: N/A;  930  . ENDOMETRIAL ABLATION    . TONSILLECTOMY     age 55  . TUBAL LIGATION  9 yrs ago   [name]  . WISDOM TOOTH EXTRACTION      OB History    No data available      Social History   Social History  . Marital status: Legally Separated    Spouse name: N/A  . Number of children: N/A  . Years of education: N/A   Occupational History  . planning and Academic librarian   Social History Main Topics  . Smoking status: Never Smoker  . Smokeless tobacco: Never Used  . Alcohol use 4.2 oz/week    7 Glasses of wine per week  . Drug use: No  . Sexual activity: Yes    Birth control/ protection: Surgical   Other Topics Concern  . None   Social History Narrative   Exercise with walking, hand weight training, separated, no children    Family History  Problem Relation Age of Onset  . Hyperlipidemia Mother   . Hypertension Mother   . Arthritis Maternal Aunt   . Gallbladder  disease Maternal Aunt   . Arthritis Paternal Aunt   . Arthritis Maternal Grandmother   . Arthritis Maternal Grandfather   . Cancer Maternal Grandfather     leukemia, colon  . Diabetes Maternal Grandfather   . Colon cancer Maternal Grandfather   . Arthritis Paternal Grandfather   . Asthma Paternal Grandfather   . COPD Paternal Grandfather   . Anesthesia problems Neg Hx   . Hypotension Neg Hx   . Malignant hyperthermia Neg Hx   . Pseudochol deficiency Neg Hx   . Heart disease Neg Hx   . Stroke Neg Hx      Current Outpatient Prescriptions:  .  Estradiol (ELESTRIN) 0.52 MG/0.87 GM (0.06%) GEL, Apply 2 application topically daily., Disp: 52 g, Rfl: 11 .  progesterone (PROMETRIUM) 200 MG capsule, TAKE ONE (1) CAPSULE EACH DAY, Disp: 30 capsule, Rfl: 11  Review of Systems  Review of Systems  Constitutional: Negative for fever, chills, weight loss, malaise/fatigue and diaphoresis.  HENT: Negative for hearing loss, ear pain, nosebleeds, congestion, sore throat, neck pain, tinnitus and ear discharge.   Eyes: Negative for blurred vision, double vision, photophobia, pain, discharge and redness.  Respiratory: Negative for cough, hemoptysis, sputum production, shortness of breath, wheezing and stridor.  Cardiovascular: Negative for chest pain, palpitations, orthopnea, claudication, leg swelling and PND.  Gastrointestinal: negative for abdominal pain. Negative for heartburn, nausea, vomiting, diarrhea, constipation, blood in stool and melena.  Genitourinary: Negative for dysuria, urgency, frequency, hematuria and flank pain.  Musculoskeletal: Negative for myalgias, back pain, joint pain and falls.  Skin: Negative for itching and rash.  Neurological: Negative for dizziness, tingling, tremors, sensory change, speech change, focal weakness, seizures, loss of consciousness, weakness and headaches.  Endo/Heme/Allergies: Negative for environmental allergies and polydipsia. Does not bruise/bleed  easily.  Psychiatric/Behavioral: Negative for depression, suicidal ideas, hallucinations, memory loss and substance abuse. The patient is not nervous/anxious and does not have insomnia.        Objective:  Blood pressure 128/80, pulse 80, height 5\' 6"  (1.676 m), weight 147 lb (66.7 kg).   Physical Exam  Vitals reviewed. Constitutional: She is oriented to person, place, and time. She appears well-developed and well-nourished.  HENT:  Head: Normocephalic and atraumatic.        Right Ear: External ear normal.  Left Ear: External ear normal.  Nose: Nose normal.  Mouth/Throat: Oropharynx is clear and moist.  Eyes: Conjunctivae and EOM are normal. Pupils are equal, round, and reactive to light. Right eye exhibits no discharge. Left eye exhibits no discharge. No scleral icterus.  Neck: Normal range of motion. Neck supple. No tracheal deviation present. No thyromegaly present.  Cardiovascular: Normal rate, regular rhythm, normal heart sounds and intact distal pulses.  Exam reveals no gallop and no friction rub.   No murmur heard. Respiratory: Effort normal and breath sounds normal. No respiratory distress. She has no wheezes. She has no rales. She exhibits no tenderness.  GI: Soft. Bowel sounds are normal. She exhibits no distension and no mass. There is no tenderness. There is no rebound and no guarding.  Genitourinary:  Breasts no masses skin changes or nipple changes bilaterally      Vulva is normal without lesions Vagina is pink moist without discharge Cervix normal in appearance and pap is done Uterus is normal size shape and contour Adnexa is negative with normal sized ovaries  {Rectal    hemoccult negative, normal tone, no masses  Musculoskeletal: Normal range of motion. She exhibits no edema and no tenderness.  Neurological: She is alert and oriented to person, place, and time. She has normal reflexes. She displays normal reflexes. No cranial nerve deficit. She exhibits normal muscle  tone. Coordination normal.  Skin: Skin is warm and dry. No rash noted. No erythema. No pallor.  Psychiatric: She has a normal mood and affect. Her behavior is normal. Judgment and thought content normal.       Medications Ordered at today's visit: No orders of the defined types were placed in this encounter.   Other orders placed at today's visit: No orders of the defined types were placed in this encounter.     Assessment:    Healthy female exam.    Plan:    Hormone replacement therapy: hormone replacement therapy: topical estrogen cream and prometrium. Follow up in: 1 year.     No Follow-up on file.

## 2016-05-11 LAB — CYTOLOGY - PAP

## 2016-12-06 ENCOUNTER — Ambulatory Visit (INDEPENDENT_AMBULATORY_CARE_PROVIDER_SITE_OTHER): Payer: BLUE CROSS/BLUE SHIELD | Admitting: Family Medicine

## 2016-12-06 ENCOUNTER — Encounter: Payer: Self-pay | Admitting: Family Medicine

## 2016-12-06 VITALS — BP 116/70 | HR 91 | Wt 146.0 lb

## 2016-12-06 DIAGNOSIS — R42 Dizziness and giddiness: Secondary | ICD-10-CM | POA: Diagnosis not present

## 2016-12-06 NOTE — Progress Notes (Signed)
   Subjective:    Patient ID: Rebecca [name], female    DOB: 01-30-1961, 56 y.o.   MRN: [id number]  HPI She complains of a 10 day history of started with dizziness and headache and slight malaise. No fever, chills, cough or congestion, sore throat or earache. The headache at one point was bandlike but this is gotten much better. She did see a PA at work and placed on Bonine as well as Claritin. She is still having some difficulty with the dizziness. No blurred or double vision, weakness, numbness tingling.   Review of Systems     Objective:   Physical Exam Alert and in no distress. EOMI. Other cranial nerves grossly intact. Normal finger to nose and cerebellar testing. Tympanic membranes and canals are normal. Pharyngeal area is normal. Neck is supple without adenopathy or thyromegaly. Cardiac exam shows a regular sinus rhythm without murmurs or gallops. Lungs are clear to auscultation.        Assessment & Plan:  Vertigo  Recommend continuing with Bonine on 2 or 3 times per day basis as well as using his much is 4 Advil 3 times per day for the headache. If her symptoms continue, she is to call. I will

## 2016-12-06 NOTE — Patient Instructions (Signed)
You can take as many as 4 Advil 3 times per day for the headache. Use the bony more often going to 2 or 3 times per day for the dizziness

## 2017-01-16 DIAGNOSIS — H5203 Hypermetropia, bilateral: Secondary | ICD-10-CM | POA: Diagnosis not present

## 2017-03-19 ENCOUNTER — Other Ambulatory Visit: Payer: Self-pay | Admitting: Obstetrics & Gynecology

## 2017-03-19 DIAGNOSIS — Z1231 Encounter for screening mammogram for malignant neoplasm of breast: Secondary | ICD-10-CM

## 2017-04-04 ENCOUNTER — Ambulatory Visit
Admission: RE | Admit: 2017-04-04 | Discharge: 2017-04-04 | Disposition: A | Discharge status: Home / Self Care | Payer: BLUE CROSS/BLUE SHIELD | Source: Ambulatory Visit | Attending: Obstetrics & Gynecology | Admitting: Obstetrics & Gynecology

## 2017-04-04 DIAGNOSIS — Z1231 Encounter for screening mammogram for malignant neoplasm of breast: Secondary | ICD-10-CM

## 2017-05-26 ENCOUNTER — Other Ambulatory Visit: Payer: Self-pay | Admitting: Obstetrics & Gynecology

## 2017-08-29 ENCOUNTER — Other Ambulatory Visit: Payer: Self-pay | Admitting: Obstetrics & Gynecology

## 2018-03-12 ENCOUNTER — Encounter: Payer: Self-pay | Admitting: Obstetrics & Gynecology

## 2018-03-12 ENCOUNTER — Other Ambulatory Visit (HOSPITAL_COMMUNITY)
Admission: RE | Admit: 2018-03-12 | Discharge: 2018-03-12 | Disposition: A | Discharge status: Home / Self Care | Payer: BLUE CROSS/BLUE SHIELD | Source: Ambulatory Visit | Attending: Obstetrics & Gynecology | Admitting: Obstetrics & Gynecology

## 2018-03-12 ENCOUNTER — Ambulatory Visit (INDEPENDENT_AMBULATORY_CARE_PROVIDER_SITE_OTHER): Payer: BLUE CROSS/BLUE SHIELD | Admitting: Obstetrics & Gynecology

## 2018-03-12 VITALS — BP 100/80 | HR 98 | Ht 66.0 in | Wt 146.0 lb

## 2018-03-12 DIAGNOSIS — Z01411 Encounter for gynecological examination (general) (routine) with abnormal findings: Secondary | ICD-10-CM

## 2018-03-12 DIAGNOSIS — Z1211 Encounter for screening for malignant neoplasm of colon: Secondary | ICD-10-CM

## 2018-03-12 DIAGNOSIS — Z1212 Encounter for screening for malignant neoplasm of rectum: Secondary | ICD-10-CM

## 2018-03-12 DIAGNOSIS — Z01419 Encounter for gynecological examination (general) (routine) without abnormal findings: Secondary | ICD-10-CM | POA: Diagnosis not present

## 2018-03-12 DIAGNOSIS — Z79890 Hormone replacement therapy (postmenopausal): Secondary | ICD-10-CM

## 2018-03-12 LAB — HEMOCCULT GUIAC POC 1CARD (OFFICE): FECAL OCCULT BLD: NEGATIVE

## 2018-03-12 MED ORDER — ESTRADIOL 0.52 MG/0.87 GM (0.06%) TD GEL: TRANSDERMAL | 11 refills | Status: DC

## 2018-03-12 MED ORDER — PROGESTERONE MICRONIZED 200 MG PO CAPS: ORAL_CAPSULE | ORAL | 11 refills | Status: DC

## 2018-03-12 NOTE — Addendum Note (Signed)
Addended by: Tish Frederickson A on: 03/12/2018 10:21 AM   Modules accepted: Orders

## 2018-03-12 NOTE — Progress Notes (Signed)
Subjective:     Rebecca [name] is a 57 y.o. female here for a routine exam.  Patient's last menstrual period was 08/21/2011. No obstetric history on file. Birth Control Method:  Post menopausal Menstrual Calendar(currently): amenrrheic  Current complaints: none, good vaginal health, occasionally emotionally labile.   Current acute medical issues:  none   Recent Gynecologic History Patient's last menstrual period was 08/21/2011. Last Pap: 2017,  normal Last mammogram: 2018,  normal  Past Medical History:  Diagnosis Date  . Abnormal Pap smear   . Allergy   . Chronic headache    associated with tension or sinus problems  . H/O mammogram 03/2012   sees gynecology  . Herpes simplex    oral  . Pap smear for cervical cancer screening 02/2012   Dr. Despina Hidden, [place]  . Right bundle branch block     Past Surgical History:  Procedure Laterality Date  . COLONOSCOPY  08/07/2012   Procedure: COLONOSCOPY;  Surgeon: Malissa Hippo, MD;  Location: [facility];  Service: Endoscopy;  Laterality: N/A;  930  . ENDOMETRIAL ABLATION    . TONSILLECTOMY     age 22  . TUBAL LIGATION  9 yrs ago   [name]  . WISDOM TOOTH EXTRACTION      OB History   None     Social History   Socioeconomic History  . Marital status: Legally Separated    Spouse name: Not on file  . Number of children: Not on file  . Years of education: Not on file  . Highest education level: Not on file  Occupational History  . Occupation: Dealer: [employer]  Social Needs  . Financial resource strain: Not on file  . Food insecurity:    Worry: Not on file    Inability: Not on file  . Transportation needs:    Medical: Not on file    Non-medical: Not on file  Tobacco Use  . Smoking status: Never Smoker  . Smokeless tobacco: Never Used  Substance and Sexual Activity  . Alcohol use: Yes    Alcohol/week: 4.2 oz    Types: 7 Glasses of wine per week  . Drug use: No  .  Sexual activity: Yes    Birth control/protection: Surgical  Lifestyle  . Physical activity:    Days per week: Not on file    Minutes per session: Not on file  . Stress: Not on file  Relationships  . Social connections:    Talks on phone: Not on file    Gets together: Not on file    Attends religious service: Not on file    Active member of club or organization: Not on file    Attends meetings of clubs or organizations: Not on file    Relationship status: Not on file  Other Topics Concern  . Not on file  Social History Narrative   Exercise with walking, hand weight training, separated, no children    Family History  Problem Relation Age of Onset  . Hyperlipidemia Mother   . Hypertension Mother   . Arthritis Maternal Aunt   . Gallbladder disease Maternal Aunt   . Arthritis Paternal Aunt   . Arthritis Maternal Grandmother   . Arthritis Maternal Grandfather   . Cancer Maternal Grandfather        leukemia, colon  . Diabetes Maternal Grandfather   . Colon cancer Maternal Grandfather   . Arthritis Paternal Grandfather   . Asthma  Paternal Grandfather   . COPD Paternal Grandfather   . Anesthesia problems Neg Hx   . Hypotension Neg Hx   . Malignant hyperthermia Neg Hx   . Pseudochol deficiency Neg Hx   . Heart disease Neg Hx   . Stroke Neg Hx      Current Outpatient Medications:  .  Estradiol (ELESTRIN) 0.52 MG/0.87 GM (0.06%) GEL, APPLY TWO APPLICATIONS TOPICALLY DAILY., Disp: 52 g, Rfl: 11 .  progesterone (PROMETRIUM) 200 MG capsule, TAKE ONE CAPSULE (200 MG TOTAL) BY MOUTHDAILY., Disp: 30 capsule, Rfl: 11  Review of Systems  Review of Systems  Constitutional: Negative for fever, chills, weight loss, malaise/fatigue and diaphoresis.  HENT: Negative for hearing loss, ear pain, nosebleeds, congestion, sore throat, neck pain, tinnitus and ear discharge.   Eyes: Negative for blurred vision, double vision, photophobia, pain, discharge and redness.  Respiratory: Negative for  cough, hemoptysis, sputum production, shortness of breath, wheezing and stridor.   Cardiovascular: Negative for chest pain, palpitations, orthopnea, claudication, leg swelling and PND.  Gastrointestinal: negative for abdominal pain. Negative for heartburn, nausea, vomiting, diarrhea, constipation, blood in stool and melena.  Genitourinary: Negative for dysuria, urgency, frequency, hematuria and flank pain.  Musculoskeletal: Negative for myalgias, back pain, joint pain and falls.  Skin: Negative for itching and rash.  Neurological: Negative for dizziness, tingling, tremors, sensory change, speech change, focal weakness, seizures, loss of consciousness, weakness and headaches.  Endo/Heme/Allergies: Negative for environmental allergies and polydipsia. Does not bruise/bleed easily.  Psychiatric/Behavioral: Negative for depression, suicidal ideas, hallucinations, memory loss and substance abuse. The patient is not nervous/anxious and does not have insomnia.        Objective:  Blood pressure 100/80, pulse 98, height  (1.676 m), weight 146 lb (66.2 kg), last menstrual period 08/21/2011.   Physical Exam  Vitals reviewed. Constitutional: She is oriented to person, place, and time. She appears well-developed and well-nourished.  HENT:  Head: Normocephalic and atraumatic.        Right Ear: External ear normal.  Left Ear: External ear normal.  Nose: Nose normal.  Mouth/Throat: Oropharynx is clear and moist.  Eyes: Conjunctivae and EOM are normal. Pupils are equal, round, and reactive to light. Right eye exhibits no discharge. Left eye exhibits no discharge. No scleral icterus.  Neck: Normal range of motion. Neck supple. No tracheal deviation present. No thyromegaly present.  Cardiovascular: Normal rate, regular rhythm, normal heart sounds and intact distal pulses.  Exam reveals no gallop and no friction rub.   No murmur heard. Respiratory: Effort normal and breath sounds normal. No respiratory  distress. She has no wheezes. She has no rales. She exhibits no tenderness.  GI: Soft. Bowel sounds are normal. She exhibits no distension and no mass. There is no tenderness. There is no rebound and no guarding.  Genitourinary:  Breasts no masses skin changes or nipple changes bilaterally      Vulva is normal without lesions Vagina is pink moist without discharge Cervix normal in appearance and pap is done Uterus is normal size shape and contour Adnexa is negative with normal sized ovaries  {Rectal    hemoccult negative, normal tone, no masses  Musculoskeletal: Normal range of motion. She exhibits no edema and no tenderness.  Neurological: She is alert and oriented to person, place, and time. She has normal reflexes. She displays normal reflexes. No cranial nerve deficit. She exhibits normal muscle tone. Coordination normal.  Skin: Skin is warm and dry. No rash noted. No erythema. No pallor.  Psychiatric: She has a normal mood and affect. Her behavior is normal. Judgment and thought content normal.       Medications Ordered at today's visit: Meds ordered this encounter  Medications  . progesterone (PROMETRIUM) 200 MG capsule    Sig: TAKE ONE CAPSULE (200 MG TOTAL) BY MOUTHDAILY.    Dispense:  30 capsule    Refill:  11  . Estradiol (ELESTRIN) 0.52 MG/0.87 GM (0.06%) GEL    Sig: APPLY TWO APPLICATIONS TOPICALLY DAILY.    Dispense:  52 g    Refill:  11    Other orders placed at today's visit: No orders of the defined types were placed in this encounter.     Assessment:    Healthy female exam.    Plan:    Hormone replacement therapy: hormone replacement therapy: elestrin + prometrium. Mammogram ordered. Follow up in: 1 year.     Return in about 1 year (around 03/13/2019) for yearly, with Dr Despina Hidden.

## 2018-03-14 LAB — CYTOLOGY - PAP
Diagnosis: NEGATIVE
HPV (WINDOPATH): NOT DETECTED

## 2018-04-02 IMAGING — MG DIGITAL SCREENING BILATERAL MAMMOGRAM WITH CAD
4 series · 4 of 4 positions shown · non-contrast
Comparison: Previous exam(s).

CLINICAL DATA: Screening.

EXAM:
DIGITAL SCREENING BILATERAL MAMMOGRAM WITH CAD

[R CC]
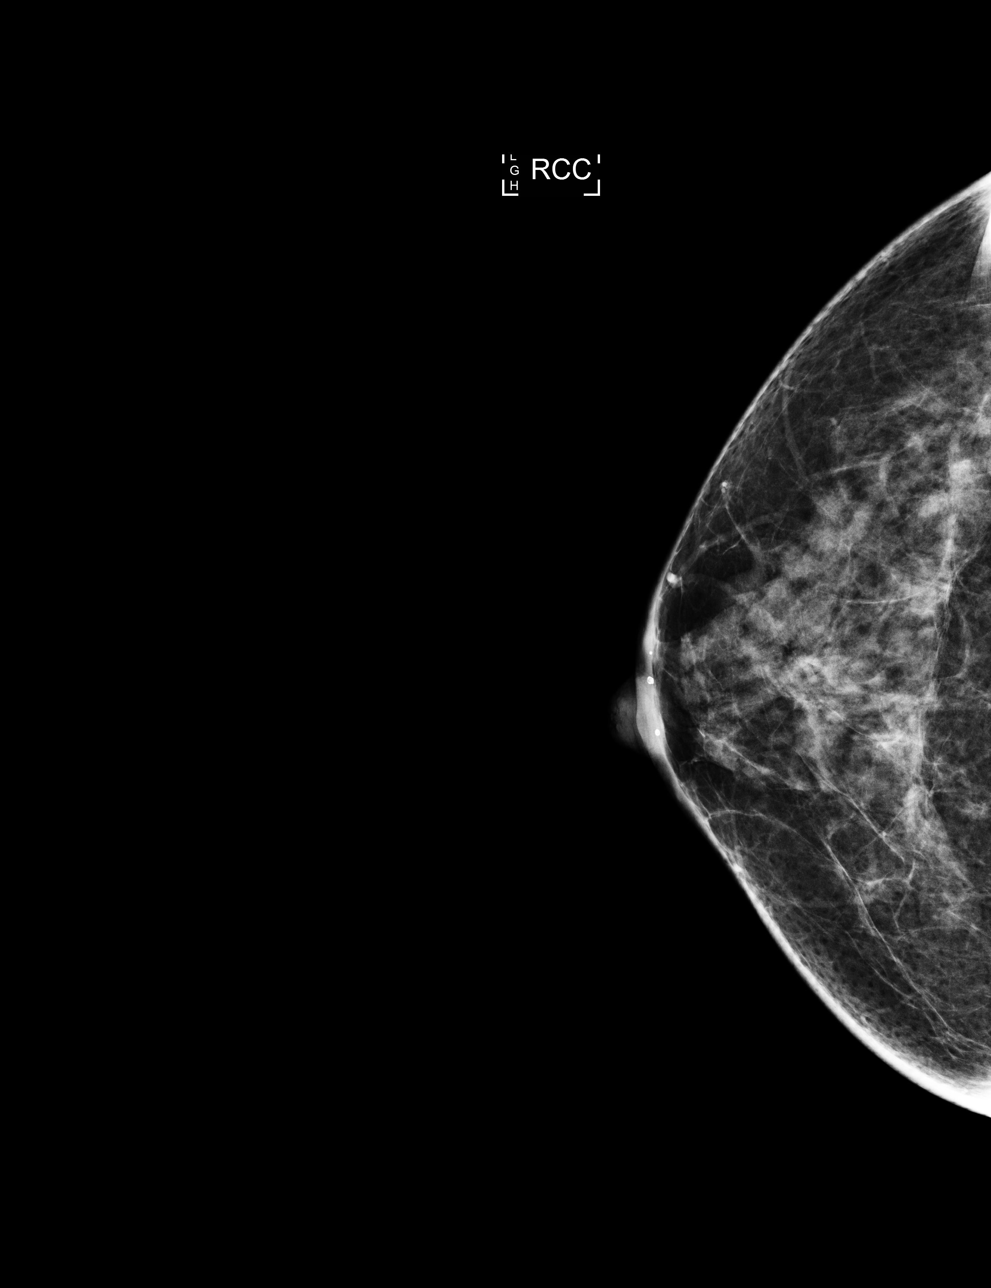

[L CC]
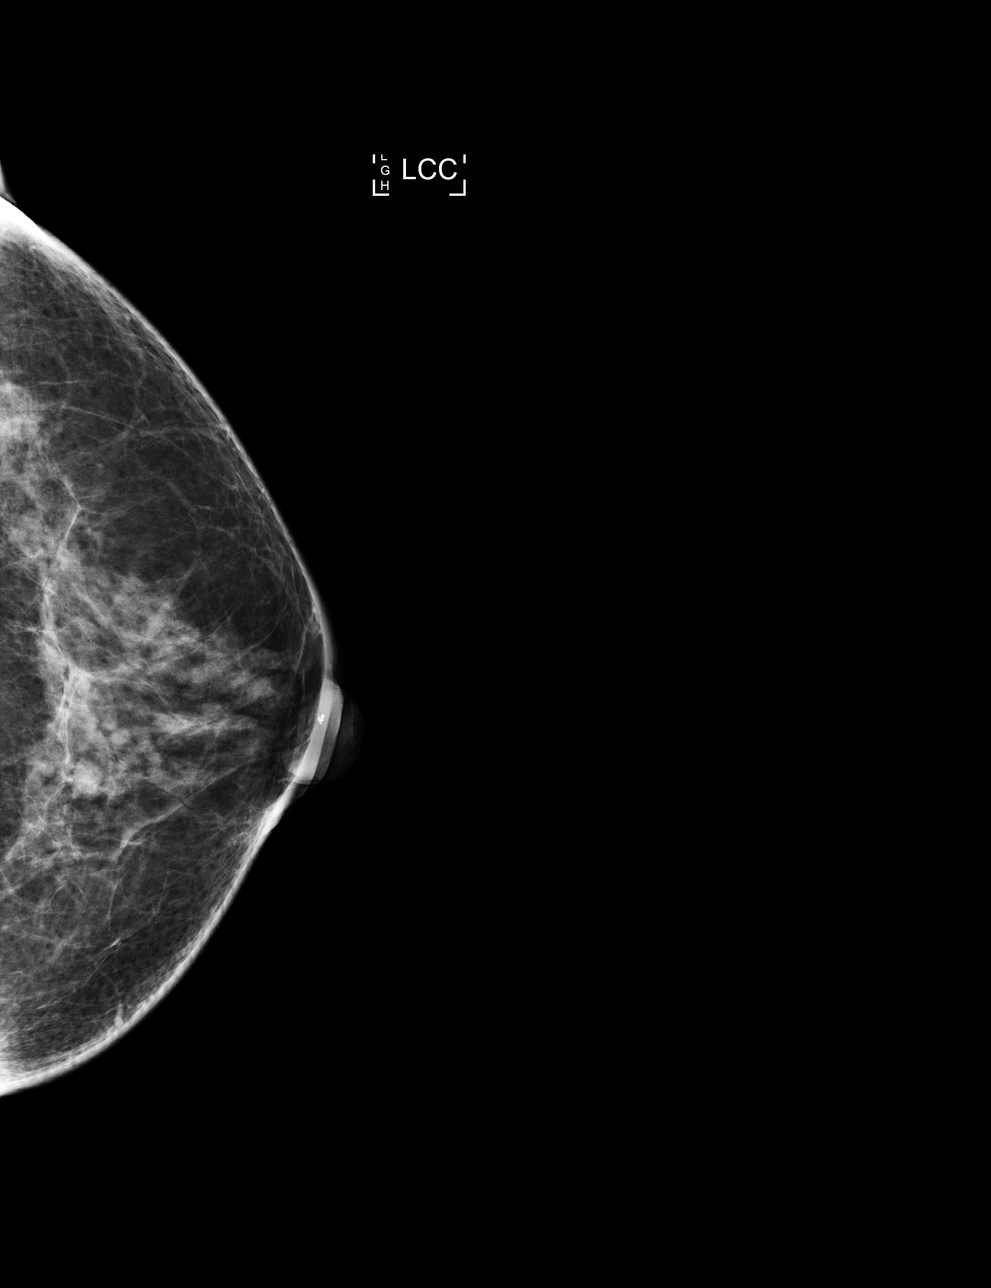

[L MLO]
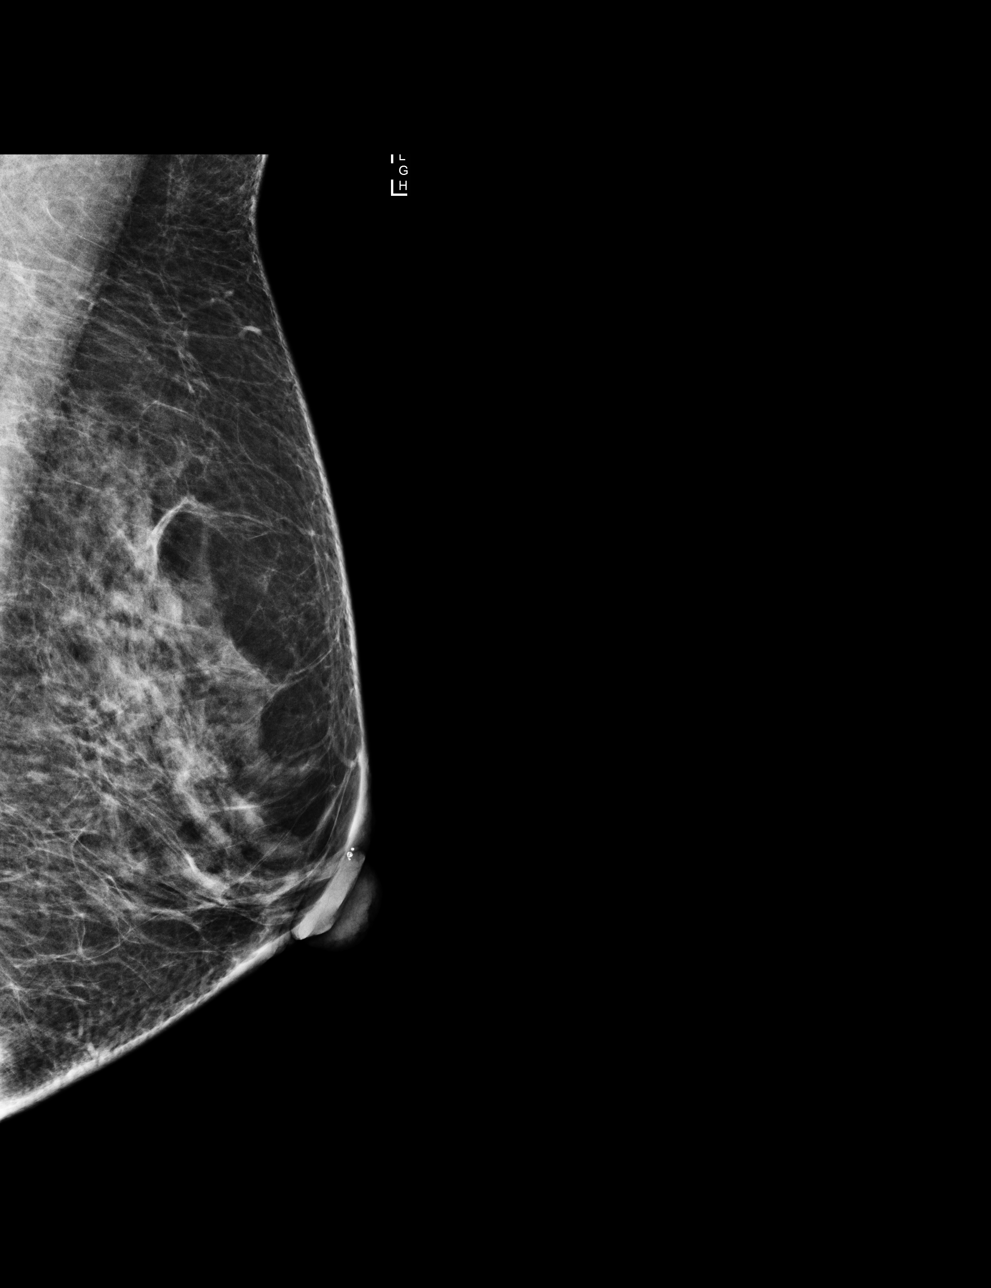

[R MLO]
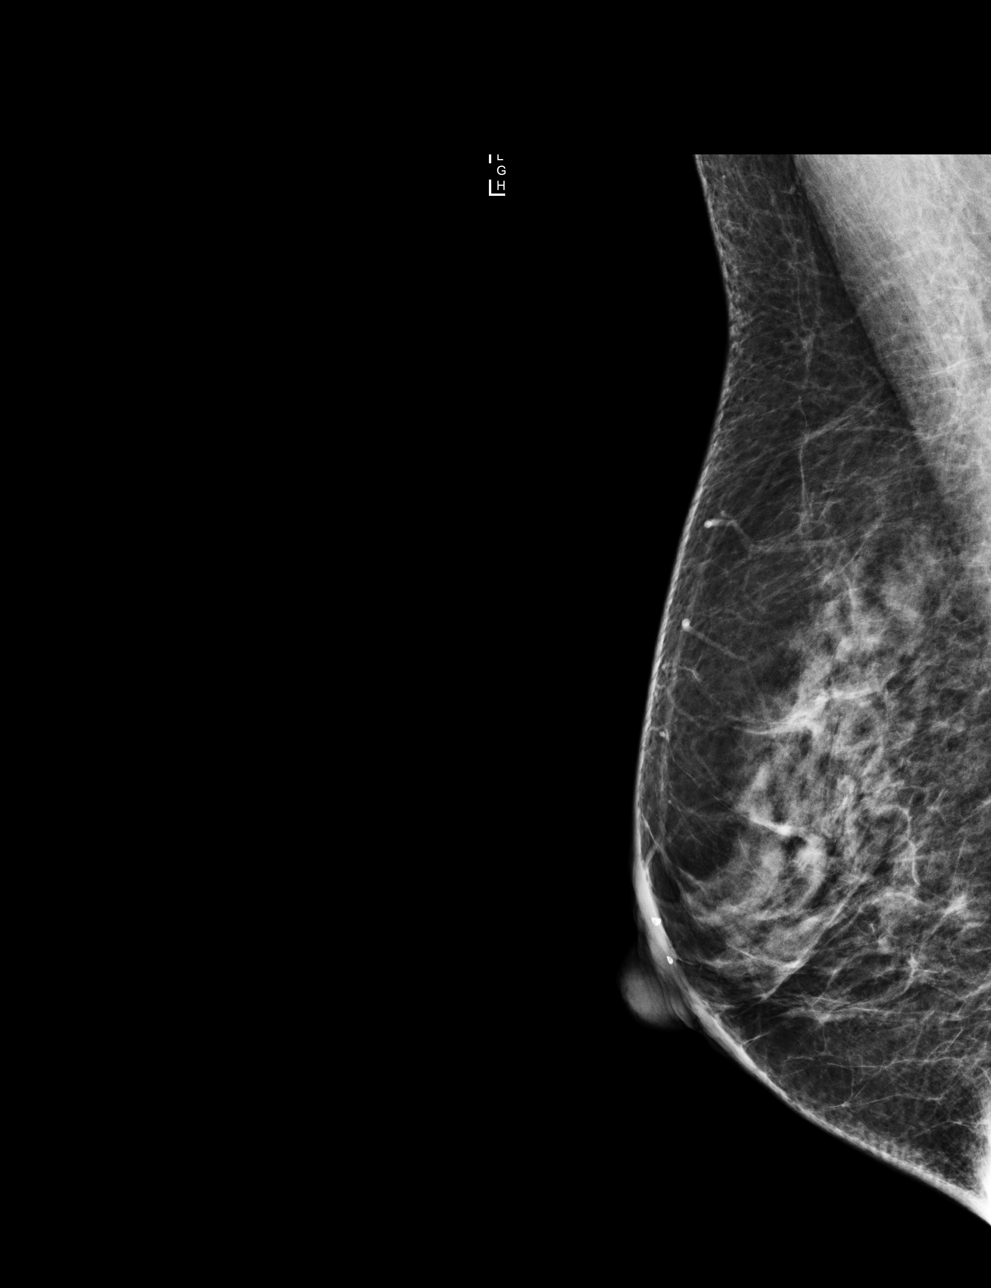

[4 of 4 positions shown; findings below may reference images not displayed]

ACR Breast Density Category c: The breast tissue is heterogeneously
dense, which may obscure small masses.
FINDINGS: There are no findings suspicious for malignancy. Images were
processed with CAD.
IMPRESSION: No mammographic evidence of malignancy. A result letter of this
screening mammogram will be mailed directly to the patient.

RECOMMENDATION:
Screening mammogram in one year. (Code:YJ-2-FEZ)

BI-RADS CATEGORY  1: Negative.

## 2018-05-06 ENCOUNTER — Encounter: Payer: Self-pay | Admitting: Obstetrics & Gynecology

## 2018-06-24 DIAGNOSIS — H5203 Hypermetropia, bilateral: Secondary | ICD-10-CM | POA: Diagnosis not present

## 2018-06-24 DIAGNOSIS — H524 Presbyopia: Secondary | ICD-10-CM | POA: Diagnosis not present

## 2018-06-24 DIAGNOSIS — H52223 Regular astigmatism, bilateral: Secondary | ICD-10-CM | POA: Diagnosis not present

## 2018-09-17 DIAGNOSIS — M25521 Pain in right elbow: Secondary | ICD-10-CM | POA: Diagnosis not present

## 2018-10-01 ENCOUNTER — Telehealth: Payer: Self-pay | Admitting: *Deleted

## 2018-10-01 ENCOUNTER — Telehealth: Payer: Self-pay | Admitting: Obstetrics & Gynecology

## 2018-10-01 MED ORDER — PROGESTERONE MICRONIZED 200 MG PO CAPS: ORAL_CAPSULE | ORAL | 11 refills | Status: DC

## 2018-10-01 NOTE — Telephone Encounter (Signed)
[facility]ing [facility]

## 2018-12-16 ENCOUNTER — Other Ambulatory Visit: Payer: Self-pay | Admitting: Obstetrics & Gynecology

## 2018-12-16 MED ORDER — ESTRADIOL 2 MG PO TABS: 2.0000 mg | ORAL_TABLET | Freq: Every day | ORAL | 11 refills | Status: DC

## 2018-12-19 ENCOUNTER — Other Ambulatory Visit: Payer: Self-pay | Admitting: Obstetrics & Gynecology

## 2018-12-19 MED ORDER — ESTRADIOL 2 MG PO TABS
2.0000 mg | ORAL_TABLET | Freq: Every day | ORAL | 11 refills | Status: DC
Start: 2018-12-19 — End: 2020-01-13

## 2019-10-16 ENCOUNTER — Other Ambulatory Visit: Payer: Self-pay | Admitting: Obstetrics & Gynecology

## 2019-11-20 ENCOUNTER — Other Ambulatory Visit: Payer: Self-pay

## 2019-11-20 ENCOUNTER — Ambulatory Visit (INDEPENDENT_AMBULATORY_CARE_PROVIDER_SITE_OTHER): Payer: Self-pay | Admitting: Adult Health

## 2019-11-20 ENCOUNTER — Encounter: Payer: Self-pay | Admitting: Adult Health

## 2019-11-20 VITALS — BP 134/81 | HR 82 | Ht 66.0 in | Wt 135.0 lb

## 2019-11-20 DIAGNOSIS — N950 Postmenopausal bleeding: Secondary | ICD-10-CM | POA: Insufficient documentation

## 2019-11-20 DIAGNOSIS — R319 Hematuria, unspecified: Secondary | ICD-10-CM

## 2019-11-20 LAB — POCT URINALYSIS DIPSTICK
Glucose, UA: NEGATIVE
Ketones, UA: NEGATIVE
Leukocytes, UA: NEGATIVE
Nitrite, UA: NEGATIVE
Protein, UA: NEGATIVE

## 2019-11-20 NOTE — Progress Notes (Signed)
  Subjective:     Patient ID: Rebecca [name], female   DOB: 1961/09/25, 59 y.o.   MRN: [id number]  HPI Rebecca [name] is a 59 year old white female, widowed, in 10 year relationship,PM on HRT complaining of having seen blood in urine, spotting after sex and vaginal spotting at times.Has some cramping too.She has had an ablation, too.  PCP is Dr Rebecca [name].   Review of Systems +cramping +vaginal spotting  +bleed after sex Saw blood in urine, no burning when pees   Reviewed past medical,surgical, social and family history. Reviewed medications and allergies.     Objective:   Physical Exam BP 134/81 (BP Location: Left Arm, Patient Position: Sitting, Cuff Size: Normal)   Pulse 82   Ht 5\' 6"  (1.676 m)   Wt 135 lb (61.2 kg)   LMP 08/21/2011   BMI 21.79 kg/m   Urine is clear, and has trace blood, no odor. Skin warm and dry.Pelvic: external genitalia is normal in appearance no lesions, vagina: muddy brown discharge without odor,urethra has no lesions or masses noted, cervix:smooth, brown discharge at os, uterus: normal size, shape and contour, mildly tender, no masses felt, adnexa: no masses or tenderness noted. Bladder is non tender and no masses felt.  Examination chaperoned by 13/02/2011 CMA.     Assessment:     1. Hematuria, unspecified type  2. PMB (postmenopausal bleeding) Return in am for GYN [name] to assess uterus and ovaries Review handout on PMB, discussed could be benign, or polyp or endometrial cancer     Plan:     Will talk when results of [place] back

## 2019-11-20 NOTE — Patient Instructions (Signed)
Postmenopausal Bleeding  Postmenopausal bleeding is any bleeding that a woman has after she has entered into menopause. Menopause is the end of a woman's fertile years. After menopause, a woman no longer ovulates and does not have menstrual periods. Postmenopausal bleeding may have various causes, including:  Menopausal hormone therapy (MHT).  Endometrial atrophy. After menopause, low estrogen hormone levels cause the membrane that lines the uterus (endometrium) to become thinner. You may have bleeding as the endometrium thins.  Endometrial hyperplasia. This condition is caused by excess estrogen hormones and low levels of progesterone hormones. The excess estrogen causes the endometrium to thicken, which can lead to bleeding. In some cases, this can lead to cancer of the uterus.  Endometrial cancer.  Non-cancerous growths (polyps) on the endometrium, the lining of the uterus, or the cervix.  Uterine fibroids. These are non-cancerous growths in or around the uterus muscle tissue that can cause heavy bleeding. Any type of postmenopausal bleeding, even if it appears to be a typical menstrual period, should be evaluated by your health care provider. Treatment will depend on the cause of the bleeding. Follow these instructions at home:  Pay attention to any changes in your symptoms.  Avoid using tampons and douches as told by your health care provider.  Change your pads regularly.  Get regular pelvic exams and Pap tests.  Take iron supplements as told by your health care provider.  Take over-the-counter and prescription medicines only as told by your health care provider.  Keep all follow-up visits as told by your health care provider. This is important. Contact a health care provider if:  Your bleeding lasts more than 1 week.  You have abdominal pain.  You have bleeding with or after sexual intercourse.  You have bleeding that happens more often than every 3 weeks. Get help  right away if:  You have a fever, chills, headache, dizziness, muscle aches, and bleeding.  You have severe pain with bleeding.  You are passing blood clots.  You have heavy bleeding, need more than 1 pad an hour, and have never experienced this before.  You feel faint. Summary  Postmenopausal bleeding is any bleeding that a woman has after she has entered into menopause.  Postmenopausal bleeding may have various causes. Treatment will depend on the cause of the bleeding.  Any type of postmenopausal bleeding, even if it appears to be a typical menstrual period, should be evaluated by your health care provider.  Be sure to pay attention to any changes in your symptoms and keep all follow-up visits as told by your health care provider. This information is not intended to replace advice given to you by your health care provider. Make sure you discuss any questions you have with your health care provider. Document Revised: 01/09/2018 Document Reviewed: 12/26/2016 Elsevier Patient Education  2020 Elsevier Inc.  

## 2019-11-21 ENCOUNTER — Ambulatory Visit (INDEPENDENT_AMBULATORY_CARE_PROVIDER_SITE_OTHER): Payer: Self-pay

## 2019-11-21 ENCOUNTER — Other Ambulatory Visit: Payer: Self-pay

## 2019-11-21 DIAGNOSIS — N950 Postmenopausal bleeding: Secondary | ICD-10-CM

## 2019-11-21 NOTE — Progress Notes (Signed)
PELVIC US TA/TV: homogeneous, anteverted, partial septate uterus,wnl,small amount of complex fluid within the endometrium,EEC 2.9 mm,limited view of ovaries,ovaries appear to be WNL,no free fluid,no pain during ultrasound

## 2019-11-24 ENCOUNTER — Telehealth: Payer: Self-pay | Admitting: Adult Health

## 2019-11-24 MED ORDER — PROGESTERONE MICRONIZED 200 MG PO CAPS: ORAL_CAPSULE | ORAL | 6 refills | Status: DC

## 2019-11-24 NOTE — Telephone Encounter (Signed)
Pt aware US showed normal ovaries, EEC 2.9.. and partial septate in uterus, discussed with Dr Despina Hidden will increase Prometrium to 400 mg daily continue estrace

## 2020-01-13 ENCOUNTER — Other Ambulatory Visit: Payer: Self-pay | Admitting: Obstetrics & Gynecology

## 2020-07-09 ENCOUNTER — Other Ambulatory Visit: Payer: Self-pay | Admitting: Adult Health

## 2020-08-09 ENCOUNTER — Other Ambulatory Visit: Payer: Self-pay | Admitting: Adult Health

## 2020-08-09 ENCOUNTER — Other Ambulatory Visit: Payer: Self-pay | Admitting: Women's Health

## 2020-12-16 ENCOUNTER — Other Ambulatory Visit: Payer: Self-pay | Admitting: Adult Health

## 2021-01-07 ENCOUNTER — Other Ambulatory Visit: Payer: Self-pay

## 2021-01-07 ENCOUNTER — Encounter: Payer: Self-pay | Admitting: Obstetrics & Gynecology

## 2021-01-07 ENCOUNTER — Ambulatory Visit (INDEPENDENT_AMBULATORY_CARE_PROVIDER_SITE_OTHER): Payer: Self-pay | Admitting: Obstetrics & Gynecology

## 2021-01-07 ENCOUNTER — Other Ambulatory Visit (HOSPITAL_COMMUNITY)
Admission: RE | Admit: 2021-01-07 | Discharge: 2021-01-07 | Disposition: A | Discharge status: Home / Self Care | Payer: Self-pay | Source: Ambulatory Visit | Attending: Obstetrics & Gynecology | Admitting: Obstetrics & Gynecology

## 2021-01-07 VITALS — BP 118/73 | HR 73 | Ht 66.0 in | Wt 138.0 lb

## 2021-01-07 DIAGNOSIS — Z01419 Encounter for gynecological examination (general) (routine) without abnormal findings: Secondary | ICD-10-CM

## 2021-01-07 DIAGNOSIS — Z1231 Encounter for screening mammogram for malignant neoplasm of breast: Secondary | ICD-10-CM

## 2021-01-07 MED ORDER — PROGESTERONE 200 MG PO CAPS: 400.0000 mg | ORAL_CAPSULE | Freq: Every day | ORAL | 11 refills | Status: DC

## 2021-01-07 MED ORDER — ESTRADIOL 2 MG PO TABS: 2.0000 mg | ORAL_TABLET | Freq: Every day | ORAL | 11 refills | Status: DC

## 2021-01-07 NOTE — Progress Notes (Signed)
Subjective:     Rebecca [name] is a 60 y.o. female here for a routine exam.  Patient's last menstrual period was 08/21/2011. G1P0010 Birth Control Method:  menopausal Menstrual Calendar(currently): amenorrheic  Current complaints: none.   Current acute medical issues:  none   Recent Gynecologic History Patient's last menstrual period was 08/21/2011. Last Pap: 2019,  normal Last mammogram: 03/2017,  normal  Past Medical History:  Diagnosis Date  . Abnormal Pap smear   . Allergy   . Chronic headache    associated with tension or sinus problems  . H/O mammogram 03/2012   sees gynecology  . Herpes simplex    oral  . Pap smear for cervical cancer screening 02/2012   Dr. Despina Hidden, [place]  . Right bundle branch block   . Vaginal Pap smear, abnormal     Past Surgical History:  Procedure Laterality Date  . COLONOSCOPY  08/07/2012   Procedure: COLONOSCOPY;  Surgeon: Malissa Hippo, MD;  Location: [facility];  Service: Endoscopy;  Laterality: N/A;  930  . ENDOMETRIAL ABLATION    . TONSILLECTOMY     age 78  . TUBAL LIGATION  9 yrs ago   [name]  . WISDOM TOOTH EXTRACTION      OB History    Gravida  1   Para      Term      Preterm      AB  1   Living        SAB      IAB      Ectopic      Multiple      Live Births              Social History   Socioeconomic History  . Marital status: Widowed    Spouse name: Not on file  . Number of children: Not on file  . Years of education: Not on file  . Highest education level: Not on file  Occupational History  . Occupation: Dealer: [employer]  Tobacco Use  . Smoking status: Never Smoker  . Smokeless tobacco: Never Used  Vaping Use  . Vaping Use: Never used  Substance and Sexual Activity  . Alcohol use: Yes    Alcohol/week: 7.0 standard drinks    Types: 7 Glasses of wine per week  . Drug use: No  . Sexual activity: Yes    Birth control/protection:  Surgical    Comment: tubal and ablation  Other Topics Concern  . Not on file  Social History Narrative   Exercise with walking, hand weight training, separated, no children   Social Determinants of Health   Financial Resource Strain: Low Risk   . Difficulty of Paying Living Expenses: Not very hard  Food Insecurity: No Food Insecurity  . Worried About Programme researcher, broadcasting/film/video in the Last Year: Never true  . Ran Out of Food in the Last Year: Never true  Transportation Needs: No Transportation Needs  . Lack of Transportation (Medical): No  . Lack of Transportation (Non-Medical): No  Physical Activity: Sufficiently Active  . Days of Exercise per Week: 7 days  . Minutes of Exercise per Session: 40 min  Stress: No Stress Concern Present  . Feeling of Stress : Only a little  Social Connections: Moderately Integrated  . Frequency of Communication with Friends and Family: More than three times a week  . Frequency of Social Gatherings with Friends and Family: More than  three times a week  . Attends Religious Services: More than 4 times per year  . Active Member of Clubs or Organizations: Yes  . Attends Banker Meetings: More than 4 times per year  . Marital Status: Widowed    Family History  Problem Relation Age of Onset  . Hyperlipidemia Mother   . Hypertension Mother   . Arthritis Maternal Aunt   . Gallbladder disease Maternal Aunt   . Arthritis Paternal Aunt   . Arthritis Maternal Grandmother   . Arthritis Maternal Grandfather   . Cancer Maternal Grandfather        leukemia, colon  . Diabetes Maternal Grandfather   . Colon cancer Maternal Grandfather   . Arthritis Paternal Grandfather   . Asthma Paternal Grandfather   . COPD Paternal Grandfather   . Anesthesia problems Neg Hx   . Hypotension Neg Hx   . Malignant hyperthermia Neg Hx   . Pseudochol deficiency Neg Hx   . Heart disease Neg Hx   . Stroke Neg Hx      Current Outpatient Medications:  .   Cyanocobalamin (VITAMIN B-12 PO), Take by mouth daily., Disp: , Rfl:  .  Loratadine (CLARITIN PO), Take by mouth., Disp: , Rfl:  .  Multiple Vitamins-Minerals (ZINC PO), Take by mouth daily., Disp: , Rfl:  .  estradiol (ESTRACE) 2 MG tablet, Take 1 tablet (2 mg total) by mouth daily., Disp: 30 tablet, Rfl: 11 .  progesterone (PROMETRIUM) 200 MG capsule, Take 2 capsules (400 mg total) by mouth at bedtime., Disp: 60 capsule, Rfl: 11  Review of Systems  Review of Systems  Constitutional: Negative for fever, chills, weight loss, malaise/fatigue and diaphoresis.  HENT: Negative for hearing loss, ear pain, nosebleeds, congestion, sore throat, neck pain, tinnitus and ear discharge.   Eyes: Negative for blurred vision, double vision, photophobia, pain, discharge and redness.  Respiratory: Negative for cough, hemoptysis, sputum production, shortness of breath, wheezing and stridor.   Cardiovascular: Negative for chest pain, palpitations, orthopnea, claudication, leg swelling and PND.  Gastrointestinal: negative for abdominal pain. Negative for heartburn, nausea, vomiting, diarrhea, constipation, blood in stool and melena.  Genitourinary: Negative for dysuria, urgency, frequency, hematuria and flank pain.  Musculoskeletal: Negative for myalgias, back pain, joint pain and falls.  Skin: Negative for itching and rash.  Neurological: Negative for dizziness, tingling, tremors, sensory change, speech change, focal weakness, seizures, loss of consciousness, weakness and headaches.  Endo/Heme/Allergies: Negative for environmental allergies and polydipsia. Does not bruise/bleed easily.  Psychiatric/Behavioral: Negative for depression, suicidal ideas, hallucinations, memory loss and substance abuse. The patient is not nervous/anxious and does not have insomnia.        Objective:  Blood pressure 118/73, pulse 73, height 5\' 6"  (1.676 m), weight 138 lb (62.6 kg), last menstrual period 08/21/2011.   Physical Exam   Vitals reviewed. Constitutional: She is oriented to person, place, and time. She appears well-developed and well-nourished.  HENT:  Head: Normocephalic and atraumatic.        Right Ear: External ear normal.  Left Ear: External ear normal.  Nose: Nose normal.  Mouth/Throat: Oropharynx is clear and moist.  Eyes: Conjunctivae and EOM are normal. Pupils are equal, round, and reactive to light. Right eye exhibits no discharge. Left eye exhibits no discharge. No scleral icterus.  Neck: Normal range of motion. Neck supple. No tracheal deviation present. No thyromegaly present.  Cardiovascular: Normal rate, regular rhythm, normal heart sounds and intact distal pulses.  Exam reveals no gallop and  no friction rub.   No murmur heard. Respiratory: Effort normal and breath sounds normal. No respiratory distress. She has no wheezes. She has no rales. She exhibits no tenderness.  GI: Soft. Bowel sounds are normal. She exhibits no distension and no mass. There is no tenderness. There is no rebound and no guarding.  Genitourinary:  Breasts no masses skin changes or nipple changes bilaterally      Vulva is normal without lesions Vagina is pink moist without discharge Cervix normal in appearance and pap is done Uterus is normal size shape and contour Adnexa is negative with normal sized ovaries  {Rectal    hemoccult negative, normal tone, no masses  Musculoskeletal: Normal range of motion. She exhibits no edema and no tenderness.  Neurological: She is alert and oriented to person, place, and time. She has normal reflexes. She displays normal reflexes. No cranial nerve deficit. She exhibits normal muscle tone. Coordination normal.  Skin: Skin is warm and dry. No rash noted. No erythema. No pallor.  Psychiatric: She has a normal mood and affect. Her behavior is normal. Judgment and thought content normal.       Medications Ordered at today's visit: Meds ordered this encounter  Medications  . estradiol  (ESTRACE) 2 MG tablet    Sig: Take 1 tablet (2 mg total) by mouth daily.    Dispense:  30 tablet    Refill:  11  . progesterone (PROMETRIUM) 200 MG capsule    Sig: Take 2 capsules (400 mg total) by mouth at bedtime.    Dispense:  60 capsule    Refill:  11    Other orders placed at today's visit: Orders Placed This Encounter  Procedures  . MM Digital Screening      Assessment:    Normal Gyn exam.    Plan:    Hormone replacement therapy: estrace 2 mg and prometrium 400 mg daily. Mammogram ordered. Follow up in: 3 years.     Return in about 3 years (around 01/08/2024) for yearly.

## 2021-01-11 LAB — CYTOLOGY - PAP
Comment: NEGATIVE
Diagnosis: NEGATIVE
High risk HPV: NEGATIVE

## 2021-02-25 ENCOUNTER — Encounter: Payer: Self-pay | Admitting: *Deleted

## 2021-02-25 ENCOUNTER — Telehealth: Payer: Self-pay | Admitting: *Deleted

## 2021-02-25 NOTE — Telephone Encounter (Signed)
Multiple attempts made to schedule patient for mammogram.  Mychart message sent to patient.

## 2021-02-25 NOTE — Telephone Encounter (Signed)
-----   Message from Marny Lowenstein, PA-C sent at 01/31/2021  3:28 PM EDT ----- Tish,   Dr. Despina Hidden ordered mammogram at last visit but it doesn't look like it is scheduled. Can you schedule?   Thanks  Raynelle Fanning

## 2021-03-16 ENCOUNTER — Encounter: Payer: Self-pay | Admitting: Family Medicine

## 2021-08-31 ENCOUNTER — Telehealth: Payer: Self-pay

## 2021-08-31 NOTE — Telephone Encounter (Signed)
Attempted to call patient. Left voice message with [facility] contact information.

## 2022-01-10 ENCOUNTER — Other Ambulatory Visit: Payer: Self-pay | Admitting: Obstetrics & Gynecology

## 2022-06-03 ENCOUNTER — Ambulatory Visit (INDEPENDENT_AMBULATORY_CARE_PROVIDER_SITE_OTHER): Payer: BC Managed Care – PPO

## 2022-06-03 ENCOUNTER — Ambulatory Visit
Admission: EM | Admit: 2022-06-03 | Discharge: 2022-06-03 | Disposition: A | Discharge status: Home / Self Care | Payer: BC Managed Care – PPO | Attending: Nurse Practitioner | Admitting: Nurse Practitioner

## 2022-06-03 ENCOUNTER — Encounter: Payer: Self-pay | Admitting: Emergency Medicine

## 2022-06-03 DIAGNOSIS — R109 Unspecified abdominal pain: Secondary | ICD-10-CM

## 2022-06-03 DIAGNOSIS — R350 Frequency of micturition: Secondary | ICD-10-CM

## 2022-06-03 LAB — POCT URINALYSIS DIP (MANUAL ENTRY)
Bilirubin, UA: NEGATIVE
Glucose, UA: NEGATIVE mg/dL
Ketones, POC UA: NEGATIVE mg/dL
Nitrite, UA: NEGATIVE
Protein Ur, POC: NEGATIVE mg/dL
Spec Grav, UA: 1.025 (ref 1.010–1.025)
Urobilinogen, UA: 0.2 E.U./dL
pH, UA: 6 (ref 5.0–8.0)

## 2022-06-03 MED ORDER — NITROFURANTOIN MONOHYD MACRO 100 MG PO CAPS: 100.0000 mg | ORAL_CAPSULE | Freq: Two times a day (BID) | ORAL | 0 refills | Status: DC

## 2022-06-03 MED ORDER — ONDANSETRON 4 MG PO TBDP
4.0000 mg | ORAL_TABLET | Freq: Once | ORAL | Status: AC
Start: 2022-06-03 — End: 2022-06-03
  Administered 2022-06-03: 4 mg via ORAL

## 2022-06-03 MED ORDER — TAMSULOSIN HCL 0.4 MG PO CAPS: 0.4000 mg | ORAL_CAPSULE | Freq: Every day | ORAL | 0 refills | Status: DC

## 2022-06-03 MED ORDER — ONDANSETRON HCL 4 MG PO TABS: 4.0000 mg | ORAL_TABLET | Freq: Three times a day (TID) | ORAL | 0 refills | Status: DC | PRN

## 2022-06-03 MED ORDER — KETOROLAC TROMETHAMINE 60 MG/2ML IM SOLN
60.0000 mg | Freq: Once | INTRAMUSCULAR | Status: AC
  Administered 2022-06-03: 60 mg via INTRAMUSCULAR

## 2022-06-03 NOTE — ED Triage Notes (Signed)
Left lower back pain with nausea and vomiting x 1 hour.  Feels clammy and dizzy.

## 2022-06-03 NOTE — Discharge Instructions (Addendum)
There were no abnormalities seen on your x-ray.  Your urinalysis does show leukocytes and blood which could possibly indicate urinary tract infection or a kidney stone.  A urine culture has been ordered.  If the results of the culture are positive, you will be contacted. Take medication as prescribed. Increase fluids.  Try to drink at least 10-12 8 ounce glasses of water daily while symptoms persist. Use the strainer provided to strain your urine to see if you pass a kidney stone. May take over-the-counter Tylenol for pain or discomfort. Develop a toileting schedule that allows you to void every 2 hours while symptoms persist. Avoid caffeine to include tea, soda, or coffee while symptoms persist. Go to the emergency department immediately if you develop worsening back pain, flank pain, nausea, vomiting, or if you develop fever or chills.

## 2022-06-03 NOTE — ED Provider Notes (Signed)
[facility]    CSN: [id number] Arrival date & time: 06/03/22  1325      History   Chief Complaint No chief complaint on file.   HPI Rebecca [name] is a 61 y.o. female.   The history is provided by the patient.   Patient presents for complaints of left-sided low back pain, nausea, vomiting, urinary symptoms.  Patient states that she also feels clammy and dizzy.  Symptoms started 2 to 3 days ago, but patient states that earlier today is when she developed the left lower back pain.  Patient states that 1 day ago, she had several episodes of diarrhea.  Patient states that she has also had bloating and urinary frequency.  She denies fever, chills, chest pain, dysuria, hematuria, or cloudy urine.  Patient denies previous history of kidney stones.    Past Medical History:  Diagnosis Date   Abnormal Pap smear    Allergy    Chronic headache    associated with tension or sinus problems   H/O mammogram 03/2012   sees gynecology   Herpes simplex    oral   Pap smear for cervical cancer screening 02/2012   Dr. Despina Hidden, [place]   Right bundle branch block    Vaginal Pap smear, abnormal     Patient Active Problem List   Diagnosis Date Noted   PMB (postmenopausal bleeding) 11/20/2019   Hematuria 11/20/2019   Anterior chest wall pain 08/24/2011   UTI (lower urinary tract infection) 01/14/2002   Sinusitis 09/15/2001   Strep pharyngitis 09/15/1998    Past Surgical History:  Procedure Laterality Date   COLONOSCOPY  08/07/2012   Procedure: COLONOSCOPY;  Surgeon: Malissa Hippo, MD;  [facility]: [facility];  Service: Endoscopy;  Laterality: N/A;  930   ENDOMETRIAL ABLATION     TONSILLECTOMY     age 78   TUBAL LIGATION  9 yrs ago   [name]   WISDOM TOOTH EXTRACTION      OB History     Gravida  1   Para      Term      Preterm      AB  1   Living         SAB      IAB      Ectopic      Multiple      Live Births               Home  Medications    Prior to Admission medications   Medication Sig Start Date End Date Taking? Authorizing Provider  Cyanocobalamin (VITAMIN B-12 PO) Take by mouth daily.    [provider]  estradiol (ESTRACE) 2 MG tablet TAKE ONE TABLET (2MG  TOTAL) BY MOUTH DAILY 01/12/22   01/14/22, MD  Loratadine (CLARITIN PO) Take by mouth.    [provider]  Multiple Vitamins-Minerals (ZINC PO) Take by mouth daily.    [provider]  nitrofurantoin, macrocrystal-monohydrate, (MACROBID) 100 MG capsule Take 1 capsule (100 mg total) by mouth 2 (two) times daily. 06/03/22   Melba Araki-Warren, 06/05/22, NP  ondansetron (ZOFRAN) 4 MG tablet Take 1 tablet (4 mg total) by mouth every 8 (eight) hours as needed for nausea or vomiting. 06/03/22   Guila Owensby-Warren, 06/05/22, NP  progesterone (PROMETRIUM) 200 MG capsule TAKE TWO CAPSULES (400MG  TOTAL) BY MOUTHAT BEDTIME 01/12/22   , MD  tamsulosin (FLOMAX) 0.4 MG CAPS capsule Take 1 capsule (0.4 mg total) by  mouth daily after supper. 06/03/22   Lamia Mariner-Warren, Sadie Haber, NP    Family History Family History  Problem Relation Age of Onset   Hyperlipidemia Mother    Hypertension Mother    Arthritis Maternal Aunt    Gallbladder disease Maternal Aunt    Arthritis Paternal Aunt    Arthritis Maternal Grandmother    Arthritis Maternal Grandfather    Cancer Maternal Grandfather        leukemia, colon   Diabetes Maternal Grandfather    Colon cancer Maternal Grandfather    Arthritis Paternal Grandfather    Asthma Paternal Grandfather    COPD Paternal Grandfather    Anesthesia problems Neg Hx    Hypotension Neg Hx    Malignant hyperthermia Neg Hx    Pseudochol deficiency Neg Hx    Heart disease Neg Hx    Stroke Neg Hx     Social History Social History   Tobacco Use   Smoking status: Never   Smokeless tobacco: Never  Vaping Use   Vaping Use: Never used  Substance Use Topics   Alcohol use: Yes    Alcohol/week: 7.0  standard drinks of alcohol    Types: 7 Glasses of wine per week   Drug use: No     Allergies   Patient has no known allergies.   Review of Systems Review of Systems Per HPI  Physical Exam Triage Vital Signs ED Triage Vitals  Enc Vitals Group     BP 06/03/22 1400 138/80     Pulse Rate 06/03/22 1400 89     Resp 06/03/22 1400 18     Temp 06/03/22 1400 98.3 F (36.8 C)     Temp Source 06/03/22 1400 Oral     SpO2 06/03/22 1400 99 %     Weight --      Height --      Head Circumference --      Peak Flow --      Pain Score 06/03/22 1401 10     Pain Loc --      Pain Edu? --      Excl. in GC? --    No data found.  Updated Vital Signs BP 138/80 (BP Location: Right Arm)   Pulse 89   Temp 98.3 F (36.8 C) (Oral)   Resp 18   LMP 08/21/2011   SpO2 99%   Visual Acuity Right Eye Distance:   Left Eye Distance:   Bilateral Distance:    Right Eye Near:   Left Eye Near:    Bilateral Near:     Physical Exam Vitals and nursing note reviewed.  Constitutional:      General: She is not in acute distress.    Appearance: Normal appearance.  HENT:     Head: Normocephalic.     Mouth/Throat:     Mouth: Mucous membranes are moist.  Eyes:     Extraocular Movements: Extraocular movements intact.     Conjunctiva/sclera: Conjunctivae normal.     Pupils: Pupils are equal, round, and reactive to light.  Cardiovascular:     Rate and Rhythm: Normal rate and regular rhythm.     Pulses: Normal pulses.     Heart sounds: Normal heart sounds.  Pulmonary:     Effort: Pulmonary effort is normal. No respiratory distress.     Breath sounds: Normal breath sounds. No wheezing or rales.  Abdominal:     General: Bowel sounds are normal.     Tenderness: There is abdominal tenderness. There  is no right CVA tenderness or left CVA tenderness.  Musculoskeletal:     Cervical back: Normal range of motion.  Lymphadenopathy:     Cervical: No cervical adenopathy.  Neurological:     General: No  focal deficit present.     Mental Status: She is alert and oriented to person, place, and time.  Psychiatric:        Mood and Affect: Mood normal.        Behavior: Behavior normal.      UC Treatments / Results  Labs (all labs ordered are listed, but only abnormal results are displayed) Labs Reviewed  POCT URINALYSIS DIP (MANUAL ENTRY) - Abnormal; Notable for the following components:      Result Value   Blood, UA small (*)    Leukocytes, UA Small (1+) (*)    All other components within normal limits    EKG   Radiology DG Abd 2 Views  Result Date: 06/03/2022 CLINICAL DATA:  Abdominal pain, flank pain EXAM: ABDOMEN - 2 VIEW COMPARISON:  None Available. FINDINGS: Bowel gas pattern is nonspecific. Small amount of stool is seen in colon. No abnormal masses or calcifications are seen. Kidneys are partly obscured by bowel contents. There is mild dextroscoliosis in the lumbar spine. Visualized lower lung fields are clear. IMPRESSION: Nonspecific bowel gas pattern. Electronically Signed   By: Ernie Avena M.D.   On: 06/03/2022 14:45    Procedures Procedures (including critical care time)  Medications Ordered in UC Medications  ondansetron (ZOFRAN-ODT) disintegrating tablet 4 mg (4 mg Oral Given 06/03/22 1407)  ketorolac (TORADOL) injection 60 mg (60 mg Intramuscular Given 06/03/22 1430)    Initial Impression / Assessment and Plan / UC Course  I have reviewed the triage vital signs and the nursing notes.  Pertinent labs & imaging results that were available during my care of the patient were reviewed by me and considered in my medical decision making (see chart for details).  Patient presents with sudden onset of left lower back pain/flank pain that started this morning.  Patient reports recent increased urination.  On exam, patient does not have any CVA tenderness.  Her vital signs are stable, she is in no acute distress.  Patient was administered Zofran for nausea and Toradol  for pain in the clinic.  X-ray of the abdomen is negative.  Differential diagnoses include urinary tract infection, kidney stone.  We will treat patient empirically with Macrobid for 5 days to cover for possible urinary tract infection given the leukocytes seen on her urinalysis.  We will also provide tamsulosin to cover for possible kidney stone.  Supportive care recommendations were provided to the patient to include straining her urine.  Urine culture is pending at this time.  Patient advised that she will be contacted if the culture results are positive and informed to stop the medication.  Patient advised to go to the emergency department if she develops worsening abdominal pain, nausea, vomiting, or if she develops fever, chills, or worsening symptoms. Final Clinical Impressions(s) / UC Diagnoses   Final diagnoses:  Left flank pain  Urinary frequency     Discharge Instructions      There were no abnormalities seen on your x-ray.  Your urinalysis does show leukocytes and blood which could possibly indicate urinary tract infection or a kidney stone.  A urine culture has been ordered.  If the results of the culture are positive, you will be contacted. Take medication as prescribed. Increase fluids.  Try to  drink at least 10-12 8 ounce glasses of water daily while symptoms persist. Use the strainer provided to strain your urine to see if you pass a kidney stone. May take over-the-counter Tylenol for pain or discomfort. Develop a toileting schedule that allows you to void every 2 hours while symptoms persist. Avoid caffeine to include tea, soda, or coffee while symptoms persist. Go to the emergency department immediately if you develop worsening back pain, flank pain, nausea, vomiting, or if you develop fever or chills.     ED Prescriptions     Medication Sig Dispense Auth. Provider   tamsulosin (FLOMAX) 0.4 MG CAPS capsule  (Status: Discontinued) Take 1 capsule (0.4 mg total) by mouth  daily after supper. 30 capsule [place] Souter-Warren, Sadie Haber, NP   nitrofurantoin, macrocrystal-monohydrate, (MACROBID) 100 MG capsule  (Status: Discontinued) Take 1 capsule (100 mg total) by mouth 2 (two) times daily. 10 capsule Iman Orourke-Warren, Sadie Haber, NP   ondansetron (ZOFRAN) 4 MG tablet  (Status: Discontinued) Take 1 tablet (4 mg total) by mouth every 8 (eight) hours as needed for nausea or vomiting. 20 tablet Teddie Curd-Warren, Sadie Haber, NP   nitrofurantoin, macrocrystal-monohydrate, (MACROBID) 100 MG capsule Take 1 capsule (100 mg total) by mouth 2 (two) times daily. 10 capsule Ehab Humber-Warren, Sadie Haber, NP   ondansetron (ZOFRAN) 4 MG tablet Take 1 tablet (4 mg total) by mouth every 8 (eight) hours as needed for nausea or vomiting. 20 tablet Sheccid Lahmann-Warren, Sadie Haber, NP   tamsulosin (FLOMAX) 0.4 MG CAPS capsule Take 1 capsule (0.4 mg total) by mouth daily after supper. 30 capsule Terrian Sentell-Warren, Sadie Haber, NP      PDMP not reviewed this encounter.   Abran Cantor, NP 06/03/22 [phone]

## 2022-06-21 ENCOUNTER — Encounter: Payer: Self-pay | Admitting: Internal Medicine

## 2022-07-10 ENCOUNTER — Encounter (INDEPENDENT_AMBULATORY_CARE_PROVIDER_SITE_OTHER): Payer: Self-pay | Admitting: *Deleted

## 2022-07-13 ENCOUNTER — Other Ambulatory Visit: Payer: Self-pay | Admitting: Adult Health

## 2022-07-13 ENCOUNTER — Other Ambulatory Visit (INDEPENDENT_AMBULATORY_CARE_PROVIDER_SITE_OTHER): Payer: BC Managed Care – PPO | Admitting: *Deleted

## 2022-07-13 DIAGNOSIS — M545 Low back pain, unspecified: Secondary | ICD-10-CM | POA: Diagnosis not present

## 2022-07-13 DIAGNOSIS — R319 Hematuria, unspecified: Secondary | ICD-10-CM | POA: Diagnosis not present

## 2022-07-13 LAB — POCT URINALYSIS DIPSTICK
Glucose, UA: NEGATIVE
Ketones, UA: NEGATIVE
Nitrite, UA: NEGATIVE
Protein, UA: NEGATIVE

## 2022-07-13 MED ORDER — CEPHALEXIN 500 MG PO CAPS: 500.0000 mg | ORAL_CAPSULE | Freq: Four times a day (QID) | ORAL | 0 refills | Status: AC

## 2022-07-13 NOTE — Progress Notes (Signed)
   NURSE VISIT- UTI SYMPTOMS   SUBJECTIVE:  Rebecca [name] is a 61 y.o. G24P0010 female here for UTI symptoms. She is a GYN patient. She reports  back pain off and on .  OBJECTIVE:  LMP 08/21/2011   Appears well, in no apparent distress  Results for orders placed or performed in visit on 07/13/22 (from the past 24 hour(s))  POCT Urinalysis Dipstick   Collection Time: 07/13/22 10:10 AM  Result Value Ref Range   Color, UA     Clarity, UA     Glucose, UA Negative Negative   Bilirubin, UA     Ketones, UA neg    Spec Grav, UA     Blood, UA trace    pH, UA     Protein, UA Negative Negative   Urobilinogen, UA     Nitrite, UA neg    Leukocytes, UA Small (1+) (A) Negative   Appearance     Odor      ASSESSMENT: GYN patient with UTI symptoms and negative nitrites  PLAN: Discussed with Derrek Monaco, AGNP   Rx sent by provider today: Yes Urine culture sent Call or return to clinic prn if these symptoms worsen or fail to improve as anticipated. Follow-up: as needed   Levy Pupa  07/13/2022 10:19 AM

## 2022-07-13 NOTE — Progress Notes (Signed)
Rx keflex 

## 2022-07-14 LAB — URINALYSIS, ROUTINE W REFLEX MICROSCOPIC
Bilirubin, UA: NEGATIVE
Glucose, UA: NEGATIVE
Ketones, UA: NEGATIVE
Nitrite, UA: NEGATIVE
Protein,UA: NEGATIVE
RBC, UA: NEGATIVE
Specific Gravity, UA: 1.016 (ref 1.005–1.030)
Urobilinogen, Ur: 0.2 mg/dL (ref 0.2–1.0)
pH, UA: 6 (ref 5.0–7.5)

## 2022-07-14 LAB — MICROSCOPIC EXAMINATION
Casts: NONE SEEN /lpf
Epithelial Cells (non renal): >10 /hpf — AB (ref 0–10)

## 2022-07-15 LAB — URINE CULTURE

## 2022-07-25 ENCOUNTER — Encounter: Payer: Self-pay | Admitting: Internal Medicine

## 2022-08-07 ENCOUNTER — Encounter: Payer: Self-pay | Admitting: Internal Medicine

## 2022-11-21 ENCOUNTER — Telehealth: Payer: BC Managed Care – PPO | Admitting: Family Medicine

## 2022-12-20 ENCOUNTER — Ambulatory Visit (INDEPENDENT_AMBULATORY_CARE_PROVIDER_SITE_OTHER): Payer: BC Managed Care – PPO | Admitting: Family Medicine

## 2022-12-20 ENCOUNTER — Encounter: Payer: Self-pay | Admitting: Family Medicine

## 2022-12-20 VITALS — BP 132/80 | HR 81 | Temp 99.7°F | Ht 66.0 in | Wt 138.0 lb

## 2022-12-20 DIAGNOSIS — Z0000 Encounter for general adult medical examination without abnormal findings: Secondary | ICD-10-CM

## 2022-12-20 DIAGNOSIS — Z1159 Encounter for screening for other viral diseases: Secondary | ICD-10-CM

## 2022-12-20 DIAGNOSIS — Z7689 Persons encountering health services in other specified circumstances: Secondary | ICD-10-CM

## 2022-12-20 DIAGNOSIS — Z114 Encounter for screening for human immunodeficiency virus [HIV]: Secondary | ICD-10-CM | POA: Diagnosis not present

## 2022-12-20 DIAGNOSIS — Z1211 Encounter for screening for malignant neoplasm of colon: Secondary | ICD-10-CM

## 2022-12-20 DIAGNOSIS — Z1231 Encounter for screening mammogram for malignant neoplasm of breast: Secondary | ICD-10-CM

## 2022-12-20 NOTE — Progress Notes (Signed)
New Patient Office Visit  Subjective    Patient ID: Rebecca [name], female    DOB: 28-Dec-1960  Age: 62 y.o. MRN: [id number]  CC:  Chief Complaint  Patient presents with   Establish Care    HPI Rebecca [name] presents to establish care. Oriented to practice routines and expectations. She has been seeing a NP at work for primary care needs, has not had a physical with labs in the last year. She eats a regular diet and exercises 4 days weekly briskly walking.  Mammogram due PAP done 01/07/2021, due 2027 Colon cancer   Outpatient Encounter Medications as of 12/20/2022  Medication Sig   estradiol (ESTRACE) 2 MG tablet TAKE ONE TABLET ('2MG'$  TOTAL) BY MOUTH DAILY   Multiple Vitamins-Minerals (ZINC PO) Take by mouth daily.   progesterone (PROMETRIUM) 200 MG capsule TAKE TWO CAPSULES ('400MG'$  TOTAL) BY MOUTHAT BEDTIME   Cyanocobalamin (VITAMIN B-12 PO) Take by mouth daily. (Patient not taking: Reported on 12/20/2022)   Loratadine (CLARITIN PO) Take by mouth. (Patient not taking: Reported on 12/20/2022)   No facility-administered encounter medications on file as of 12/20/2022.    Past Medical History:  Diagnosis Date   Abnormal Pap smear    Allergy    Chronic headache    associated with tension or sinus problems   H/O mammogram 03/2012   sees gynecology   Herpes simplex    oral   Pap smear for cervical cancer screening 02/2012   Dr. Elonda Husky, [place]   Right bundle branch block    Vaginal Pap smear, abnormal     Past Surgical History:  Procedure Laterality Date   COLONOSCOPY  08/07/2012   Procedure: COLONOSCOPY;  Surgeon: Rogene Houston, MD;  [facility]: [facility];  Service: Endoscopy;  Laterality: N/A;  46   ENDOMETRIAL ABLATION     TONSILLECTOMY     age 61   TUBAL LIGATION  9 yrs ago   [place] EXTRACTION      Family History  Problem Relation Age of Onset   Hyperlipidemia Mother    Hypertension Mother    Arthritis Maternal Aunt    Gallbladder disease  Maternal Aunt    Arthritis Paternal Aunt    Arthritis Maternal Grandmother    Arthritis Maternal Grandfather    Cancer Maternal Grandfather        leukemia, colon   Diabetes Maternal Grandfather    Colon cancer Maternal Grandfather    Arthritis Paternal Grandfather    Asthma Paternal Grandfather    COPD Paternal Grandfather    Anesthesia problems Neg Hx    Hypotension Neg Hx    Malignant hyperthermia Neg Hx    Pseudochol deficiency Neg Hx    Heart disease Neg Hx    Stroke Neg Hx     Social History   Socioeconomic History   Marital status: Married    Spouse name: Not on file   Number of children: Not on file   Years of education: Not on file   Highest education level: Not on file  Occupational History   Occupation: planning and Educational psychologist    Employer: [employer]  Tobacco Use   Smoking status: Never   Smokeless tobacco: Never  Vaping Use   Vaping Use: Never used  Substance and Sexual Activity   Alcohol use: Yes    Alcohol/week: 7.0 standard drinks of alcohol    Types: 7 Glasses of wine per week   Drug use: No  Sexual activity: Yes    Birth control/protection: Surgical    Comment: tubal and ablation  Other Topics Concern   Not on file  Social History Narrative   Exercise with walking, hand weight training, separated, no children   Social Determinants of Health   Financial Resource Strain: Low Risk  (01/07/2021)   Overall Financial Resource Strain (CARDIA)    Difficulty of Paying Living Expenses: Not very hard  Food Insecurity: No Food Insecurity (01/07/2021)   Hunger Vital Sign    Worried About Running Out of Food in the Last Year: Never true    Ran Out of Food in the Last Year: Never true  Transportation Needs: No Transportation Needs (01/07/2021)   PRAPARE - Hydrologist (Medical): No    Lack of Transportation (Non-Medical): No  Physical Activity: Sufficiently Active (01/07/2021)   Exercise Vital Sign    Days of  Exercise per Week: 7 days    Minutes of Exercise per Session: 40 min  Stress: No Stress Concern Present (01/07/2021)   [place]    Feeling of Stress : Only a little  Social Connections: Moderately Integrated (01/07/2021)   Social Connection and Isolation Panel [NHANES]    Frequency of Communication with Friends and Family: More than three times a week    Frequency of Social Gatherings with Friends and Family: More than three times a week    Attends Religious Services: More than 4 times per year    Active Member of [employer] or Organizations: Yes    Attends Archivist Meetings: More than 4 times per year    Marital Status: Widowed  Intimate Partner Violence: Not At Risk (01/07/2021)   Humiliation, Afraid, Rape, and Kick questionnaire    Fear of Current or Ex-Partner: No    Emotionally Abused: No    Physically Abused: No    Sexually Abused: No    Review of Systems  Constitutional:  Positive for malaise/fatigue.  HENT: Negative.    Eyes: Negative.   Respiratory: Negative.    Cardiovascular: Negative.   Gastrointestinal: Negative.   Genitourinary: Negative.   Musculoskeletal: Negative.   Skin: Negative.   Neurological: Negative.   Endo/Heme/Allergies: Negative.   Psychiatric/Behavioral: Negative.    All other systems reviewed and are negative.       Objective    BP 132/80   Pulse 81   Temp 99.7 F (37.6 C) (Oral)   Ht '5\' 6"'$  (1.676 m)   Wt 138 lb (62.6 kg)   LMP 08/21/2011   SpO2 98%   BMI 22.27 kg/m   Physical Exam Vitals and nursing note reviewed.  Constitutional:      Appearance: Normal appearance. She is normal weight.  HENT:     Head: Normocephalic and atraumatic.     Right Ear: Tympanic membrane, ear canal and external ear normal.     Left Ear: Tympanic membrane, ear canal and external ear normal.     Nose: Nose normal.     Mouth/Throat:     Mouth: Mucous membranes are moist.      Pharynx: Oropharynx is clear.  Eyes:     Extraocular Movements: Extraocular movements intact.     Conjunctiva/sclera: Conjunctivae normal.     Pupils: Pupils are equal, round, and reactive to light.  Cardiovascular:     Rate and Rhythm: Normal rate and regular rhythm.     Pulses: Normal pulses.     Heart sounds:  Normal heart sounds.  Pulmonary:     Effort: Pulmonary effort is normal.     Breath sounds: Normal breath sounds.  Abdominal:     General: Bowel sounds are normal.     Palpations: Abdomen is soft.  Musculoskeletal:        General: Normal range of motion.     Cervical back: Normal range of motion and neck supple.  Skin:    General: Skin is warm and dry.     Capillary Refill: Capillary refill takes less than 2 seconds.  Neurological:     General: No focal deficit present.     Mental Status: She is alert and oriented to person, place, and time. Mental status is at baseline.  Psychiatric:        Mood and Affect: Mood normal.        Behavior: Behavior normal.        Thought Content: Thought content normal.        Judgment: Judgment normal.         Assessment & Plan:   Problem List Items Addressed This Visit       Other   Physical exam, annual    Today your medical history was reviewed and routine physical exam with labs was performed. Recommend 150 minutes of moderate intensity exercise weekly and consuming a well-balanced diet. Advised to stop smoking if a smoker, avoid smoking if a non-smoker, limit alcohol consumption to 1 drink per day for women and 2 drinks per day for men, and avoid illicit drug use. Counseled on safe sex practices and offered STI testing today. Counseled on the importance of sunscreen use. Counseled in mental health awareness and when to seek medical care. Vaccine maintenance discussed. Appropriate health maintenance items reviewed. Return to office in 1 year for annual physical exam.       Relevant Orders   CBC with Differential/Platelet    COMPLETE METABOLIC PANEL WITH GFR   Lipid panel   TSH   LDL Cholesterol, Direct   Magnesium   Other Visit Diagnoses     Encounter to establish care with new doctor    -  Primary   Colon cancer screening       Relevant Orders   Cologuard   Encounter for screening mammogram for malignant neoplasm of breast       Relevant Orders   MM DIGITAL SCREENING BILATERAL   Screening for HIV (human immunodeficiency virus)       Relevant Orders   HIV Antibody (routine testing w rflx)   Need for hepatitis C screening test       Relevant Orders   Hepatitis C antibody       Return in about 1 year (around 12/20/2023) for annual physical.   Rubie Maid, FNP

## 2022-12-20 NOTE — Assessment & Plan Note (Signed)

## 2022-12-20 NOTE — Patient Instructions (Signed)
It was great to meet you today and I'm excited to have you join the [facility] practice. I hope you had a positive experience today! If you feel so inclined, please feel free to recommend our practice to friends and family. Mila Merry, FNP-C

## 2022-12-21 LAB — COMPLETE METABOLIC PANEL WITH GFR
AG Ratio: 1.7 (calc) (ref 1.0–2.5)
ALT: 12 U/L (ref 6–29)
AST: 15 U/L (ref 10–35)
Albumin: 4.7 g/dL (ref 3.6–5.1)
Alkaline phosphatase (APISO): 50 U/L (ref 37–153)
BUN: 16 mg/dL (ref 7–25)
CO2: 24 mmol/L (ref 20–32)
Calcium: 10.1 mg/dL (ref 8.6–10.4)
Chloride: 106 mmol/L (ref 98–110)
Creat: 0.83 mg/dL (ref 0.50–1.05)
Globulin: 2.8 g/dL (calc) (ref 1.9–3.7)
Glucose, Bld: 100 mg/dL — ABNORMAL HIGH (ref 65–99)
Potassium: 4.9 mmol/L (ref 3.5–5.3)
Sodium: 138 mmol/L (ref 135–146)
Total Bilirubin: 0.4 mg/dL (ref 0.2–1.2)
Total Protein: 7.5 g/dL (ref 6.1–8.1)
eGFR: 81 mL/min/{1.73_m2} (ref >=60)

## 2022-12-21 LAB — TSH: TSH: 2.36 mIU/L (ref 0.40–4.50)

## 2022-12-21 LAB — CBC WITH DIFFERENTIAL/PLATELET
Absolute Monocytes: 614 cells/uL (ref 200–950)
Basophils Absolute: 69 cells/uL (ref 0–200)
Basophils Relative: 0.7 %
Eosinophils Absolute: 287 cells/uL (ref 15–500)
Eosinophils Relative: 2.9 %
HCT: 39.1 % (ref 35.0–45.0)
Hemoglobin: 13.2 g/dL (ref 11.7–15.5)
Lymphs Abs: 2950 cells/uL (ref 850–3900)
MCH: 30.5 pg (ref 27.0–33.0)
MCHC: 33.8 g/dL (ref 32.0–36.0)
MCV: 90.3 fL (ref 80.0–100.0)
MPV: 10.4 fL (ref 7.5–12.5)
Monocytes Relative: 6.2 %
Neutro Abs: 5980 cells/uL (ref 1500–7800)
Neutrophils Relative %: 60.4 %
Platelets: 320 10*3/uL (ref 140–400)
RBC: 4.33 10*6/uL (ref 3.80–5.10)
RDW: 11.8 % (ref 11.0–15.0)
Total Lymphocyte: 29.8 %
WBC: 9.9 10*3/uL (ref 3.8–10.8)

## 2022-12-21 LAB — HIV ANTIBODY (ROUTINE TESTING W REFLEX): HIV 1&2 Ab, 4th Generation: NONREACTIVE

## 2022-12-21 LAB — MAGNESIUM: Magnesium: 2.2 mg/dL (ref 1.5–2.5)

## 2022-12-21 LAB — HEPATITIS C ANTIBODY: Hepatitis C Ab: NONREACTIVE

## 2022-12-21 LAB — LDL CHOLESTEROL, DIRECT: Direct LDL: 99 mg/dL (ref <100)

## 2023-01-10 DIAGNOSIS — Z1211 Encounter for screening for malignant neoplasm of colon: Secondary | ICD-10-CM | POA: Diagnosis not present

## 2023-01-16 LAB — COLOGUARD: COLOGUARD: NEGATIVE

## 2023-02-06 ENCOUNTER — Other Ambulatory Visit: Payer: Self-pay | Admitting: Obstetrics & Gynecology

## 2023-02-09 ENCOUNTER — Other Ambulatory Visit: Payer: Self-pay | Admitting: *Deleted

## 2023-02-13 ENCOUNTER — Other Ambulatory Visit: Payer: Self-pay | Admitting: Obstetrics & Gynecology

## 2023-08-09 ENCOUNTER — Ambulatory Visit (INDEPENDENT_AMBULATORY_CARE_PROVIDER_SITE_OTHER): Payer: BC Managed Care – PPO | Admitting: Orthopedic Surgery

## 2023-08-09 ENCOUNTER — Encounter: Payer: Self-pay | Admitting: Orthopedic Surgery

## 2023-08-09 VITALS — BP 134/87 | HR 82 | Ht 66.0 in | Wt 142.6 lb

## 2023-08-09 DIAGNOSIS — M65312 Trigger thumb, left thumb: Secondary | ICD-10-CM

## 2023-08-09 DIAGNOSIS — M65331 Trigger finger, right middle finger: Secondary | ICD-10-CM

## 2023-08-09 MED ORDER — METHYLPREDNISOLONE ACETATE 40 MG/ML IJ SUSP
40.0000 mg | Freq: Once | INTRAMUSCULAR | Status: AC
  Administered 2023-08-09: 40 mg via INTRA_ARTICULAR

## 2023-08-09 NOTE — Progress Notes (Signed)
Patient ID: Rebecca [name], female   DOB: 1961/01/14, 62 y.o.   MRN: [id number]  ASSESSMENT AND PLAN  Trigger finger and trigger thumb   Right middle finger and left thumb  Injections of both    Chief Complaint  Patient presents with   Hand Pain    Bilat R > L for 2-3 mos getting worse. 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 female.  Patient presents with trigger phenomenon 2 to 3 months locking phenomenon left thumb more pain in the right long finger or middle finger  Review of Systems Review of Systems  Past Medical History:  Diagnosis Date   Abnormal Pap smear    Allergy    Chronic headache    associated with tension or sinus problems   H/O mammogram 03/2012   sees gynecology   Herpes simplex    oral   Pap smear for cervical cancer screening 02/2012   Dr. Despina Hidden, [place]   Right bundle branch block    Vaginal Pap smear, abnormal     Past Surgical History:  Procedure Laterality Date   COLONOSCOPY  08/07/2012   Procedure: COLONOSCOPY;  Surgeon: Malissa Hippo, MD;  Location: [facility];  Service: Endoscopy;  Laterality: N/A;  930   ENDOMETRIAL ABLATION     TONSILLECTOMY     age 70   TUBAL LIGATION  9 yrs ago   Rebecca [name]   WISDOM TOOTH EXTRACTION      Family History  Problem Relation Age of Onset   Hyperlipidemia Mother    Hypertension Mother    Arthritis Maternal Aunt    Gallbladder disease Maternal Aunt    Arthritis Paternal Aunt    Arthritis Maternal Grandmother    Arthritis Maternal Grandfather    Cancer Maternal Grandfather        leukemia, colon   Diabetes Maternal Grandfather    Colon cancer Maternal Grandfather    Arthritis Paternal Grandfather    Asthma Paternal Grandfather    COPD Paternal Grandfather    Anesthesia problems Neg Hx    Hypotension Neg Hx    Malignant hyperthermia Neg Hx     Pseudochol deficiency Neg Hx    Heart disease Neg Hx    Stroke Neg Hx     Social History Social History   Tobacco Use   Smoking status: Never   Smokeless tobacco: Never  Vaping Use   Vaping status: Never Used  Substance Use Topics   Alcohol use: Yes    Alcohol/week: 7.0 standard drinks of alcohol    Types: 7 Glasses of wine per week   Drug use: No    Allergies  Allergen Reactions   Macrobid [Nitrofurantoin] Other (See Comments)    Chills, body aches   Sulfa Antibiotics Other (See Comments)    Muscle pain    Current Outpatient Medications  Medication Sig Dispense Refill   Cyanocobalamin (VITAMIN B-12 PO) Take by mouth daily.     estradiol (ESTRACE) 2 MG tablet TAKE ONE TABLET (2MG  TOTAL) BY MOUTH DAILY (Patient not taking: Reported on 08/09/2023) 30 tablet 11   Loratadine (CLARITIN PO) Take by mouth. (Patient not taking: Reported on 12/20/2022)     Multiple Vitamins-Minerals (ZINC PO) Take by mouth daily. (Patient not taking: Reported on 08/09/2023)     progesterone (  PROMETRIUM) 200 MG capsule TAKE 2 CAPSULES BY MOUTH EVERY NIGHT AT BEDTIME. (Patient not taking: Reported on 08/09/2023) 60 capsule 11   No current facility-administered medications for this visit.     Physical Exam BP 134/87   Pulse 82   Ht 5\' 6"  (1.676 m)   Wt 142 lb 9.6 oz (64.7 kg)   LMP 08/21/2011   BMI 23.02 kg/m  Body mass index is 23.02 kg/m.  Appearance, there are no abnormalities in terms of appearance the patient was well-developed and well-nourished. The grooming and hygiene were normal.  Mental status orientation, there was normal alertness and orientation Mood pleasant Ambulatory status normal with no assistive devices  Examination of the left hand reveals tenderness over the A1 pulley of the left thumb and then on the right hand we have the right long or middle finger Range of motion remains normal with clicking on flexion extension.  Stability tests show no abnormality of the IP  joints are MP joint. The FDP and FDS strength is normal Skin warm dry and intact without laceration or ulceration or erythema Neurologic examination normal sensation Vascular examination normal pulses with warm extremity and normal capillary refill     MEDICAL DECISION MAKING  A.  Encounter Diagnoses  Name Primary?   Trigger finger, right middle finger Yes   Trigger thumb, left thumb     B. DATA ANALYSED:    IMAGING: Independent interpretation of images: no  Orders: no  Outside records reviewed: none  C. MANAGEMENT 2 injections  Left Trigger thumb injection Medication  1 mL of 40 mg Depo-Medrol  2 mL of 1% lidocaine plain  Ethyl chloride for anesthesia  Verbal consent was obtained timeout was taken to confirm the injection site as left thumb  Alcohol was used to prepare the skin along with ethyl chloride and then the injection was made at the A1 pulley there were no complications    Trigger finger injection  Diagnosis right longer middle finger tenosynovitis trigger phenomenon Procedure injection A1 pulley Medications lidocaine 1% 1 mL and Depo-Medrol 40 mg 1 mL Skin prep alcohol and ethyl chloride Verbal consent was obtained Timeout confirmed the injection site  After cleaning the skin with alcohol and anesthetizing the skin with ethyl chloride the A1 pulley was palpated and the injection was performed without complication   Meds ordered this encounter  Medications   methylPREDNISolone acetate (DEPO-MEDROL) injection 40 mg   methylPREDNISolone acetate (DEPO-MEDROL) injection 40 mg

## 2023-09-25 ENCOUNTER — Ambulatory Visit: Payer: BC Managed Care – PPO | Admitting: Family Medicine

## 2023-09-25 ENCOUNTER — Encounter: Payer: Self-pay | Admitting: Family Medicine

## 2023-09-25 VITALS — BP 130/78 | HR 72 | Temp 97.7°F | Ht 66.0 in | Wt 145.0 lb

## 2023-09-25 DIAGNOSIS — Z23 Encounter for immunization: Secondary | ICD-10-CM

## 2023-09-25 DIAGNOSIS — R1011 Right upper quadrant pain: Secondary | ICD-10-CM | POA: Diagnosis not present

## 2023-09-25 NOTE — Progress Notes (Signed)
Subjective:  HPI: Rebecca [name] is a 62 y.o. female presenting on 09/25/2023 for Follow-up (having stomach issues concerned about gallbladder function)   HPI Patient is in today for pain under her ribs that "feels thick and hard". 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 She has been having his discomfort for months, worse when she drinks draft beer or eats chocolate, fried foods, and coconut cake.This pain has been intermittent for months. Associated with nausea and diaphoresis. Denies fever, vomiting, diarrhea, constipation, hematochezia or melena.  Review of Systems  All other systems reviewed and are negative.   Relevant past medical history reviewed and updated as indicated.   Past Medical History:  Diagnosis Date   Abnormal Pap smear    Allergy    Chronic headache    associated with tension or sinus problems   H/O mammogram 03/2012   sees gynecology   Herpes simplex    oral   Pap smear for cervical cancer screening 02/2012   Dr. Despina Hidden, [place]   Right bundle branch block    Vaginal Pap smear, abnormal      Past Surgical History:  Procedure Laterality Date   COLONOSCOPY  08/07/2012   Procedure: COLONOSCOPY;  Surgeon: Malissa Hippo, MD;  Location: [facility];  Service: Endoscopy;  Laterality: N/A;  930   ENDOMETRIAL ABLATION     TONSILLECTOMY     age 22   TUBAL LIGATION  9 yrs ago   [name]   WISDOM TOOTH EXTRACTION      Allergies and medications reviewed and updated.   Current Outpatient Medications:    Multiple Vitamins-Minerals (ZINC PO), Take by mouth daily., Disp: , Rfl:    Cyanocobalamin (VITAMIN B-12 PO), Take by mouth daily. (Patient not taking: Reported on 09/25/2023), Disp: , Rfl:    estradiol (ESTRACE) 2 MG tablet, TAKE ONE TABLET (2MG  TOTAL) BY MOUTH DAILY (Patient not taking: Reported on 09/25/2023), Disp: 30 tablet, Rfl: 11   Loratadine (CLARITIN PO), Take by mouth. (Patient not taking: Reported on 09/25/2023),  Disp: , Rfl:    progesterone (PROMETRIUM) 200 MG capsule, TAKE 2 CAPSULES BY MOUTH EVERY NIGHT AT BEDTIME. (Patient not taking: Reported on 09/25/2023), Disp: 60 capsule, Rfl: 11  Allergies  Allergen Reactions   Macrobid [Nitrofurantoin] Other (See Comments)    Chills, body aches   Sulfa Antibiotics Other (See Comments)    Muscle pain    Objective:   BP 130/78   Pulse 72   Temp 97.7 F (36.5 C) (Oral)   Ht 5\' 6"  (1.676 m)   Wt 145 lb (65.8 kg)   LMP 08/21/2011   SpO2 99%   BMI 23.40 kg/m      09/25/2023   10:23 AM 08/09/2023    2:35 PM 12/20/2022    3:05 PM  Vitals with BMI  Height 5\' 6"  5\' 6"  5\' 6"   Weight 145 lbs 142 lbs 10 oz 138 lbs  BMI 23.41 23.03 22.28  Systolic 130 134 782  Diastolic 78 87 80  Pulse 72 82 81     Physical Exam Vitals and nursing note reviewed.  Constitutional:      Appearance: Normal appearance. She is normal weight.  HENT:     Head: Normocephalic and atraumatic.  Abdominal:     General: Bowel sounds are decreased.     Palpations: Abdomen is soft.     Tenderness: There is abdominal tenderness in the right upper quadrant.  Skin:    General: Skin is  warm and dry.  Neurological:     General: No focal deficit present.     Mental Status: She is alert and oriented to person, place, and time. Mental status is at baseline.  Psychiatric:        Mood and Affect: Mood normal.        Behavior: Behavior normal.        Thought Content: Thought content normal.        Judgment: Judgment normal.     Assessment & Plan:  RUQ abdominal pain Assessment & Plan: Intermittent RUQ pain worse with consumption of fatty foods. No red flags on exam. Symptoms consistent with gallbladder etiology. Will obtain CMP and RUQ [place]. Discussed when to seek emergency medical care.   Orders: -     COMPLETE METABOLIC PANEL WITH GFR -     US ABDOMEN LIMITED RUQ (LIVER/GB); Future  Need for shingles vaccine     Follow up plan: Return if symptoms worsen or fail to  improve.  Park Meo, FNP

## 2023-09-25 NOTE — Assessment & Plan Note (Signed)
Intermittent RUQ pain worse with consumption of fatty foods. No red flags on exam. Symptoms consistent with gallbladder etiology. Will obtain CMP and RUQ [place]. Discussed when to seek emergency medical care.

## 2023-09-26 LAB — COMPLETE METABOLIC PANEL WITH GFR
AG Ratio: 2 (calc) (ref 1.0–2.5)
ALT: 14 U/L (ref 6–29)
AST: 16 U/L (ref 10–35)
Albumin: 4.7 g/dL (ref 3.6–5.1)
Alkaline phosphatase (APISO): 80 U/L (ref 37–153)
BUN: 9 mg/dL (ref 7–25)
CO2: 29 mmol/L (ref 20–32)
Calcium: 9.9 mg/dL (ref 8.6–10.4)
Chloride: 104 mmol/L (ref 98–110)
Creat: 0.67 mg/dL (ref 0.50–1.05)
Globulin: 2.3 g/dL (ref 1.9–3.7)
Glucose, Bld: 81 mg/dL (ref 65–99)
Potassium: 4.2 mmol/L (ref 3.5–5.3)
Sodium: 141 mmol/L (ref 135–146)
Total Bilirubin: 0.7 mg/dL (ref 0.2–1.2)
Total Protein: 7 g/dL (ref 6.1–8.1)
eGFR: 99 mL/min/{1.73_m2} (ref >=60)

## 2023-10-04 ENCOUNTER — Ambulatory Visit (HOSPITAL_COMMUNITY)
Admission: RE | Admit: 2023-10-04 | Discharge: 2023-10-04 | Disposition: A | Discharge status: Home / Self Care | Payer: BC Managed Care – PPO | Source: Ambulatory Visit | Attending: Family Medicine | Admitting: Family Medicine

## 2023-10-04 DIAGNOSIS — R1011 Right upper quadrant pain: Secondary | ICD-10-CM | POA: Diagnosis not present

## 2024-12-01 ENCOUNTER — Ambulatory Visit: Admitting: Orthopedic Surgery
